# Patient Record
Sex: Female | Born: 1980
Health system: Southern US, Community
[De-identification: ages and names within clinical notes are randomized; demographics above are authoritative.]

## PROBLEM LIST (undated history)

## (undated) DIAGNOSIS — N1330 Unspecified hydronephrosis: Secondary | ICD-10-CM

## (undated) DIAGNOSIS — R350 Frequency of micturition: Secondary | ICD-10-CM

## (undated) DIAGNOSIS — Z973 Presence of spectacles and contact lenses: Secondary | ICD-10-CM

## (undated) DIAGNOSIS — J111 Influenza due to unidentified influenza virus with other respiratory manifestations: Secondary | ICD-10-CM

## (undated) DIAGNOSIS — G43109 Migraine with aura, not intractable, without status migrainosus: Secondary | ICD-10-CM

## (undated) DIAGNOSIS — I499 Cardiac arrhythmia, unspecified: Secondary | ICD-10-CM

## (undated) DIAGNOSIS — K5900 Constipation, unspecified: Secondary | ICD-10-CM

## (undated) DIAGNOSIS — K219 Gastro-esophageal reflux disease without esophagitis: Secondary | ICD-10-CM

## (undated) DIAGNOSIS — J309 Allergic rhinitis, unspecified: Secondary | ICD-10-CM

## (undated) DIAGNOSIS — R002 Palpitations: Secondary | ICD-10-CM

## (undated) DIAGNOSIS — D509 Iron deficiency anemia, unspecified: Secondary | ICD-10-CM

## (undated) DIAGNOSIS — E782 Mixed hyperlipidemia: Secondary | ICD-10-CM

## (undated) DIAGNOSIS — I493 Ventricular premature depolarization: Secondary | ICD-10-CM

## (undated) DIAGNOSIS — D259 Leiomyoma of uterus, unspecified: Secondary | ICD-10-CM

## (undated) DIAGNOSIS — N939 Abnormal uterine and vaginal bleeding, unspecified: Secondary | ICD-10-CM

## (undated) DIAGNOSIS — Z9109 Other allergy status, other than to drugs and biological substances: Secondary | ICD-10-CM

## (undated) DIAGNOSIS — E039 Hypothyroidism, unspecified: Secondary | ICD-10-CM

## (undated) HISTORY — DX: Migraine with aura, not intractable, without status migrainosus: G43.109

## (undated) HISTORY — PX: HYSTEROSCOPY: SHX211

## (undated) HISTORY — PX: DILATION AND CURETTAGE OF UTERUS: SHX78

## (undated) HISTORY — DX: Ventricular premature depolarization: I49.3

## (undated) HISTORY — DX: Other allergy status, other than to drugs and biological substances: Z91.09

## (undated) HISTORY — DX: Hypothyroidism, unspecified: E03.9

---

## 2001-09-19 ENCOUNTER — Emergency Department (HOSPITAL_COMMUNITY): Admission: EM | Admit: 2001-09-19 | Discharge: 2001-09-19 | Payer: Self-pay | Admitting: Emergency Medicine

## 2006-10-12 ENCOUNTER — Ambulatory Visit (HOSPITAL_COMMUNITY): Admission: RE | Admit: 2006-10-12 | Discharge: 2006-10-12 | Payer: Self-pay | Admitting: Obstetrics and Gynecology

## 2007-01-12 ENCOUNTER — Inpatient Hospital Stay (HOSPITAL_COMMUNITY): Admission: AD | Admit: 2007-01-12 | Discharge: 2007-01-16 | Payer: Self-pay | Admitting: Obstetrics and Gynecology

## 2009-03-07 ENCOUNTER — Inpatient Hospital Stay (HOSPITAL_COMMUNITY): Admission: RE | Admit: 2009-03-07 | Discharge: 2009-03-09 | Payer: Self-pay | Admitting: Obstetrics and Gynecology

## 2009-12-03 ENCOUNTER — Encounter: Payer: Self-pay | Admitting: Cardiology

## 2009-12-06 ENCOUNTER — Ambulatory Visit: Payer: Self-pay | Admitting: Interventional Radiology

## 2009-12-06 ENCOUNTER — Emergency Department (HOSPITAL_BASED_OUTPATIENT_CLINIC_OR_DEPARTMENT_OTHER): Admission: EM | Admit: 2009-12-06 | Discharge: 2009-12-06 | Payer: Self-pay | Admitting: Emergency Medicine

## 2010-09-15 ENCOUNTER — Encounter: Admission: RE | Admit: 2010-09-15 | Discharge: 2010-09-15 | Payer: Self-pay | Admitting: Obstetrics and Gynecology

## 2011-01-26 LAB — TYPE AND SCREEN
ABO/RH(D): A NEG
Antibody Screen: POSITIVE

## 2011-01-26 LAB — CBC
HCT: 35.5 % — ABNORMAL LOW (ref 36.0–46.0)
MCHC: 35.7 g/dL (ref 30.0–36.0)
MCV: 97.3 fL (ref 78.0–100.0)
Platelets: 154 10*3/uL (ref 150–400)
Platelets: 159 10*3/uL (ref 150–400)
RDW: 14.5 % (ref 11.5–15.5)
RDW: 14.5 % (ref 11.5–15.5)

## 2011-03-05 NOTE — Op Note (Signed)
NAMEARMIDA, Christie Barron             ACCOUNT NO.:  [id number]   MEDICAL RECORD NO.:  [id number]          PATIENT TYPE:  INP   LOCATION:  9134                          FACILITY:  [facility]   PHYSICIAN:  Zelphia Cairo, MD    DATE OF BIRTH:  06-Feb-1981   DATE OF PROCEDURE:  01/13/2007  DATE OF DISCHARGE:                               OPERATIVE REPORT   PREOPERATIVE DIAGNOSES:  1. Intrauterine pregnancy at 40 plus weeks.  2. Failed induction, failure to descend.   PROCEDURE:  Primary low transverse Cesarean delivery.   SURGEON:  Zelphia Cairo, M.D.   ESTIMATED BLOOD LOSS:  600 mL.   URINE OUTPUT:  200 mL.   FLUIDS:  1600 mL.   FINDINGS:  Viable female infant with Apgars of 9 and 10. Cord pH 7.34.  Weight 8 pounds 11 ounces. Normal pelvic anatomy.   COMPLICATIONS:  None.   CONDITION:  Stable to recovery room.   ANESTHESIA:  Epidural.   PROCEDURE:  The patient was taken to the operating room where epidural  anesthesia was found to be adequate. She was placed in the supine  position with a left tilt. She was prepped and draped in sterile  fashion. A Pfannenstiel skin incision was made with a scalpel and  carried down to the underlying fascia. The fascia was incised in the  midline and extended laterally using Mayo scissors. The fascia was  tented upwards with Kocher clamps, and the underlying rectus muscles  were dissected off using the Bovie. The peritoneum was then identified,  tented upwards and entered sharply with the Metzenbaum scissors. This  was extended superiorly and inferiorly with good visualization of the  bladder. The bladder blade was then inserted. The bladder flap was  created using Metzenbaum scissors, and the bladder blade was  repositioned to protect the bladder flap.   The uterine incision was then made with a scalpel and extended manually.  The fetal vertex was brought to the uterine incision, and fundal  pressure was applied. The fetus was delivered. Nuchal  cord x1 was easily  reduced. The mouth and nose were suctioned, and the infant was taken to  the waiting pediatric staff after the cord was clamped and cut. The  placenta was then manually removed from the uterus. The uterus was  exteriorized from the pelvis and manually massaged. A dry lap sponge was  then used to clear the inside of the uterus of all clots and debris. The  uterine incision was then closed using 1-0 chromic in a running locked  fashion. Hemostasis was assured. The uterus was then delivered back into  the pelvis. The pelvis was copiously irrigated with warm normal saline.  The  peritoneum was reapproximated using Vicryl. The fascia was closed using  0 PDS. The skin was closed with staples. The patient tolerated the  procedure well. Sponge, lap, needle and instrument counts were correct  x2. She received antibiotics after cord clamp.      Zelphia Cairo, MD  Electronically Signed     GA/MEDQ  D:  01/13/2007  T:  01/14/2007  Job:  [id number]

## 2011-03-05 NOTE — Discharge Summary (Signed)
NAMEMARQUETTA, Christie Barron             ACCOUNT NO.:  [id number]   MEDICAL RECORD NO.:  [id number]          PATIENT TYPE:  INP   LOCATION:  9112                          FACILITY:  [facility]   PHYSICIAN:  Dineen Kid. Rana Snare, M.D.    DATE OF BIRTH:  Aug 14, 1981   DATE OF ADMISSION:  03/07/2009  DATE OF DISCHARGE:  03/09/2009                               DISCHARGE SUMMARY   ADMITTING DIAGNOSES:  1. Intrauterine pregnancy at term.  2. Previous cesarean section, desires repeat.   DISCHARGE DIAGNOSES:  1. Status post low transverse cesarean section.  2. Viable female infant.   PROCEDURE:  Repeat low transverse cesarean section.   REASON FOR ADMISSION:  Please see written H and P.   HOSPITAL COURSE:  The patient is 30 year old white married female,  gravida 2, para 1 that was admitted to [facility] for  scheduled cesarean section.  The patient had had a previous cesarean  delivery and desired repeat.  On the morning of admission, the patient  was taken to the operating room, where spinal anesthesia was  administered without difficulty.  A low transverse incision was made  with delivery of a viable female infant, weighing 7 pounds 15 ounces  with Apgars of 9 at 1-minute and 9 at 5 minutes.  The patient tolerated  the procedure well and was taken to the recovery room in stable  condition.  On postoperative day #1, the patient was without complaint.  Vital signs were stable.  She was afebrile.  Fundus was firm and  nontender.  Incision was clean, dry, and intact.  Laboratory findings  showed a hemoglobin of 10.2.  On postoperative day #2, the patient did  desire early discharge.  Vital signs were stable.  She was afebrile.  Fundus was firm and nontender.  Incision was clean, dry, and intact.  Staples were intact.   DISCHARGE INSTRUCTIONS:  Reviewed and the patient was later discharged  home.   CONDITION ON DISCHARGE:  Stable.   DIET:  Regular, as tolerated.   ACTIVITY:  No heavy  lifting, no driving x2 weeks, and no vaginal entry.   FOLLOWUP:  The patient is to follow up in the office in 2-3 days for  staple removal.  She is to call for temperature greater than 100  degrees, persistent nausea, vomiting, heavy vaginal bleeding and/or  redness or drainage from the incisional site.   DISCHARGE MEDICATIONS:  1. Tylox #30 one p.o. every 4-6 hours p.r.n.  2. Motrin 600 mg every 6 hours.  3. Prenatal vitamins 1 p.o. daily.  4. Colace 1 p.o. daily p.r.n.      Julio Sicks, N.P.      Dineen Kid Rana Snare, M.D.  Electronically Signed    CC/MEDQ  D:  03/24/2009  T:  03/25/2009  Job:  [id number]

## 2011-03-05 NOTE — Discharge Summary (Signed)
Christie Barron, Christie Barron             ACCOUNT NO.:  [id number]   MEDICAL RECORD NO.:  [id number]          PATIENT TYPE:  INP   LOCATION:  9134                          FACILITY:  [facility]   PHYSICIAN:  Guy Sandifer. Henderson Cloud, M.D. DATE OF BIRTH:  [birth year]   DATE OF ADMISSION:  01/12/2007  DATE OF DISCHARGE:  01/16/2007                               DISCHARGE SUMMARY   ADMISSION DIAGNOSES:  1. Intrauterine pregnancy at 40-5/7 weeks estimated gestational age.  2. Induction of labor secondary to post date.   DISCHARGE DIAGNOSES:  1. Status post low transverse cesarean section secondary to failure to      descend.  2. A viable female infant.   PROCEDURE:  Primary low transverse cesarean section.   REASON FOR ADMISSION:  Please see written H&P.   HOSPITAL COURSE:  The patient is 30 year old primigravida who was  admitted to [facility] for post date induction of labor.  On admission the patient was noted to have some irregular contractions.  Vital signs were stable.  Fetal heart tones were reactive.  Cervical  exam revealed cervix dilated to 1-2 cm. The patient was started on  Pitocin and artificial rupture of membranes was performed which had  revealed some blood tinged fluid. The patient did progress to complete  dilatation.  However, vertex never descended beyond a 0 station and  molding was noted after 2 hours of pushing.  Fetal heart tones continued  to be in the 150-160 with good beat-to-beat variability, however, due to  failure to descend and vertex molding, decision was made to proceed with  a primary low transverse cesarean section.  The patient was then  transferred to the operating room where epidural was dosed to an  adequate surgical level.  Low transverse incision was made with delivery  of a viable female infant weighing 8 pounds 11 ounces, Apgars of 9 at 1  minute and 10 at 5 minutes.  Arterial cord pH of 7.34.  The patient  tolerated procedure well and taken to the  recovery room in stable  condition.  On postoperative day #1 the patient was without complaint.  Vital signs were stable.  Abdomen soft with good return of bowel  function.  Fundus was firm.  Abdominal dressing was noted to be clean,  dry and intact.  Laboratory findings revealed hemoglobin of 10.5,  platelet count 171,000 and WBC count 17,000. On postoperative day #2 the  patient was without complaint.  Vital signs remained stable.  She is  afebrile.  Abdomen soft.  Fundus firm and nontender.  Abdominal dressing  had been removed revealing an incision that is clean, dry and intact.  On postoperative day #3 the patient was without complaint.  Vital signs  remained stable.  Fundus firm and nontender.  Incision was clean, dry  and intact.  She removed.  The patient was later discharged home.   CONDITION ON DISCHARGE:  Good, diet regular as tolerated.   ACTIVITY:  No heavy lifting, no driving x2 weeks, no vaginal entry.   FOLLOW UP:  Patient to follow up in the office in one to  two week for an  incision check.  She is to call for temperature greater than 100  degrees, persistent nausea, vomiting, heavy vaginal bleeding and/or  redness or drainage from incisional site.   DISCHARGE MEDICATIONS:  1. Percocet 5/325 #30 one p.o. every 4-6 hours p.r.n.  2. Motrin 600 mg every 6 hours.  3. Prenatal vitamins one p.o. daily.      Julio Sicks, N.P.      Guy Sandifer. Henderson Cloud, M.D.  Electronically Signed    CC/MEDQ  D:  02/27/2007  T:  02/27/2007  Job:  [id number]

## 2011-04-17 ENCOUNTER — Emergency Department (HOSPITAL_BASED_OUTPATIENT_CLINIC_OR_DEPARTMENT_OTHER)
Admission: EM | Admit: 2011-04-17 | Discharge: 2011-04-18 | Disposition: A | Discharge status: Home / Self Care | Payer: BC Managed Care – PPO | Attending: Emergency Medicine | Admitting: Emergency Medicine

## 2011-04-17 DIAGNOSIS — R220 Localized swelling, mass and lump, head: Secondary | ICD-10-CM | POA: Insufficient documentation

## 2011-04-17 DIAGNOSIS — E039 Hypothyroidism, unspecified: Secondary | ICD-10-CM | POA: Insufficient documentation

## 2011-04-17 DIAGNOSIS — R221 Localized swelling, mass and lump, neck: Secondary | ICD-10-CM | POA: Insufficient documentation

## 2011-10-19 HISTORY — PX: WISDOM TOOTH EXTRACTION: SHX21

## 2012-08-12 ENCOUNTER — Emergency Department (HOSPITAL_COMMUNITY)
Admission: EM | Admit: 2012-08-12 | Discharge: 2012-08-12 | Disposition: A | Discharge status: Home / Self Care | Payer: BC Managed Care – PPO | Attending: Emergency Medicine | Admitting: Emergency Medicine

## 2012-08-12 ENCOUNTER — Emergency Department (HOSPITAL_COMMUNITY): Payer: BC Managed Care – PPO

## 2012-08-12 ENCOUNTER — Encounter (HOSPITAL_COMMUNITY): Payer: Self-pay | Admitting: Emergency Medicine

## 2012-08-12 DIAGNOSIS — E039 Hypothyroidism, unspecified: Secondary | ICD-10-CM | POA: Insufficient documentation

## 2012-08-12 DIAGNOSIS — R002 Palpitations: Secondary | ICD-10-CM | POA: Insufficient documentation

## 2012-08-12 DIAGNOSIS — R0602 Shortness of breath: Secondary | ICD-10-CM | POA: Insufficient documentation

## 2012-08-12 LAB — COMPREHENSIVE METABOLIC PANEL
AST: 18 U/L (ref 0–37)
Albumin: 4.1 g/dL (ref 3.5–5.2)
Chloride: 106 mEq/L (ref 96–112)
Creatinine, Ser: 0.79 mg/dL (ref 0.50–1.10)
Total Bilirubin: 0.3 mg/dL (ref 0.3–1.2)

## 2012-08-12 LAB — CBC WITH DIFFERENTIAL/PLATELET
Eosinophils Absolute: 0 10*3/uL (ref 0.0–0.7)
Lymphocytes Relative: 18 % (ref 12–46)
Lymphs Abs: 1.3 10*3/uL (ref 0.7–4.0)
Neutro Abs: 5.5 10*3/uL (ref 1.7–7.7)
Neutrophils Relative %: 76 % (ref 43–77)
Platelets: 240 10*3/uL (ref 150–400)
RBC: 4.17 MIL/uL (ref 3.87–5.11)
WBC: 7.2 10*3/uL (ref 4.0–10.5)

## 2012-08-12 LAB — T4, FREE: Free T4: 1.61 ng/dL (ref 0.80–1.80)

## 2012-08-12 LAB — TSH: TSH: 0.55 u[IU]/mL (ref 0.350–4.500)

## 2012-08-12 MED ORDER — POTASSIUM CHLORIDE 20 MEQ/15ML (10%) PO LIQD
60.0000 meq | Freq: Once | ORAL | Status: AC
  Administered 2012-08-12: 40 meq via ORAL

## 2012-08-12 MED ORDER — POTASSIUM CHLORIDE 20 MEQ/15ML (10%) PO LIQD
ORAL | Status: AC
  Administered 2012-08-12: 40 meq via ORAL
  Filled 2012-08-12: qty 30

## 2012-08-12 MED ORDER — POTASSIUM CHLORIDE 20 MEQ/15ML (10%) PO LIQD
ORAL | Status: AC
  Administered 2012-08-12: 20 meq
  Filled 2012-08-12: qty 15

## 2012-08-12 MED ORDER — POTASSIUM CHLORIDE CRYS ER 20 MEQ PO TBCR
60.0000 meq | EXTENDED_RELEASE_TABLET | Freq: Once | ORAL | Status: DC
  Filled 2012-08-12: qty 3

## 2012-08-12 NOTE — ED Provider Notes (Signed)
History     CSN: [id number]  Arrival date & time 08/12/12  1600   First MD Initiated Contact with Patient 08/12/12 1605      Chief Complaint  Patient presents with  . Palpitations    (Consider location/radiation/quality/duration/timing/severity/associated sxs/prior treatment) Patient is a 31 y.o. female presenting with palpitations. The history is provided by the patient.  Palpitations  This is a new problem. The current episode started less than 1 hour ago. The problem occurs constantly. The problem has been resolved. The problem is associated with an unknown factor. Associated symptoms include shortness of breath. Pertinent negatives include no fever, no syncope and no abdominal pain. Treatments tried: oxygen. The treatment provided significant relief. Risk factors include no known risk factors. Her past medical history does not include heart disease. Past medical history comments: hypothyroidism.    Past Medical History  Diagnosis Date  . Hypothyroidism     Past Surgical History  Procedure Date  . Cesarean section     Family History  Problem Relation Age of Onset  . Heart attack Mother     History  Substance Use Topics  . Smoking status: Not on file  . Smokeless tobacco: Not on file  . Alcohol Use:     OB History    Grav Para Term Preterm Abortions TAB SAB Ect Mult Living                  Review of Systems  Constitutional: Negative for fever.  Respiratory: Positive for shortness of breath.   Cardiovascular: Positive for palpitations. Negative for syncope.  Gastrointestinal: Negative for abdominal pain.  Genitourinary: Positive for vaginal bleeding (currently on menstrual cycle).  Neurological: Negative for syncope.  Psychiatric/Behavioral: Negative for confusion.  All other systems reviewed and are negative.    Allergies  Review of patient's allergies indicates no known allergies.  Home Medications   Current Outpatient Rx  Name Route Sig Dispense  Refill  . FLUTICASONE PROPIONATE 50 MCG/ACT NA SUSP Nasal Place 2 sprays into the nose daily.    Marland Kitchen ONE-DAILY MULTI VITAMINS PO TABS Oral Take 1 tablet by mouth daily.      There were no vitals taken for this visit.  Physical Exam Nursing note and vitals reviewed.  Constitutional: Pt is alert and appears stated age. Oropharynx: Airway open without erythema or exudate. Respiratory: No respiratory distress. Equal breathing bilaterally. CV: Extremities warm and well perfused. Neuro: No motor nor sensory deficit. Head: Normocephalic and atraumatic. Eyes: No conjunctivitis, no scleral icterus. Neck: Supple, no mass. Chest: Non-tender. Abdomen: Soft, non-tender MSK: Extremities are atraumatic without deformity. Skin: No rash, no wounds.   ED Course  Procedures (including critical care time)  Labs Reviewed  COMPREHENSIVE METABOLIC PANEL - Abnormal; Notable for the following:    Potassium 3.0 (*)     All other components within normal limits  CBC WITH DIFFERENTIAL  POCT PREGNANCY, URINE  TSH  T4, FREE   Dg Chest 2 View  08/12/2012  *RADIOLOGY REPORT*  Clinical Data: Chest pain, shortness of breath, palpitations  CHEST - 2 VIEW  Comparison: None.  Findings: Lungs are clear. No pleural effusion or pneumothorax.  Cardiomediastinal silhouette is within normal limits.  Visualized osseous structures are within normal limits.  IMPRESSION: No evidence of acute cardiopulmonary disease.   Original Report Authenticated By: Charline Bills, M.D.      1. Palpitations       MDM  31 y.o. female here with palpitations.  Pertinent past problems include  hypothyroidism, c section. States synthroid increased a few weeks ago. Looks well here. No further symptoms.   Medications/interventions:  KCl for low potassium.  Data reviewed: Record review: unremarkable. EKG ordered and interpreted by me: normal rate. Sinus arrythmia, normal axis. No ST changes, no QRS widening.  Lab tests ordered and  reviewed by me: upreg neg, CBC without anemia. CMP remarkable for low K. I independently viewed the following imaging studies and reviewed radiology's interpretation as summarized: CXR without acute disease.  Course of care: On re-eval, pt continues to be asymptomatic. Feel pt is appropriate for d/c and close follow up. Counseling provided regarding diagnosis, treatment plan, follow up recommendations, and return precautions. Questions answer   Medical Decision Making discussed with ED attending Toy Baker, MD          Charm Barges, MD 08/12/12 [phone]

## 2012-08-12 NOTE — ED Notes (Signed)
Pt undressed, in gown, on monitor, continuous pulse oximetry and blood pressure cuff; EKG performed; family at bedside 

## 2012-08-12 NOTE — ED Provider Notes (Signed)
I saw and evaluated the patient, reviewed the resident's note and I agree with the findings and plan.  Patient's labs and x-rays reviewed. Patient with prior workup for her palpitations. Pending her evaluation, she'll be discharged home to be seen by her cardiologist. 2 years ago she had complete workup including a Holter monitor as well as a 2-D echocardiogram which according to the patient was negative. Her EKG at this time shows no signs of QT prolongation or heart block  Toy Baker, MD 08/12/12 1719

## 2012-08-12 NOTE — ED Notes (Signed)
Per EMS: pt from work c/o palpitations that have resolved with oxygen; pt sts SOB and some tightness in chest; pt hx of same in past; IV 20g R AC

## 2012-08-13 NOTE — ED Provider Notes (Signed)
I saw and evaluated the patient, reviewed the resident's note and I agree with the findings and plan.  Gwendelyn Lanting T Alquan Morrish, MD 08/13/12 2249 

## 2012-12-02 ENCOUNTER — Other Ambulatory Visit: Payer: Self-pay

## 2012-12-12 ENCOUNTER — Encounter (HOSPITAL_BASED_OUTPATIENT_CLINIC_OR_DEPARTMENT_OTHER): Payer: Self-pay | Admitting: *Deleted

## 2012-12-12 ENCOUNTER — Emergency Department (HOSPITAL_BASED_OUTPATIENT_CLINIC_OR_DEPARTMENT_OTHER): Payer: BC Managed Care – PPO

## 2012-12-12 ENCOUNTER — Emergency Department (HOSPITAL_BASED_OUTPATIENT_CLINIC_OR_DEPARTMENT_OTHER)
Admission: EM | Admit: 2012-12-12 | Discharge: 2012-12-12 | Disposition: A | Discharge status: Home / Self Care | Payer: BC Managed Care – PPO | Attending: Emergency Medicine | Admitting: Emergency Medicine

## 2012-12-12 DIAGNOSIS — R110 Nausea: Secondary | ICD-10-CM | POA: Insufficient documentation

## 2012-12-12 DIAGNOSIS — Y939 Activity, unspecified: Secondary | ICD-10-CM | POA: Insufficient documentation

## 2012-12-12 DIAGNOSIS — Z79899 Other long term (current) drug therapy: Secondary | ICD-10-CM | POA: Insufficient documentation

## 2012-12-12 DIAGNOSIS — IMO0002 Reserved for concepts with insufficient information to code with codable children: Secondary | ICD-10-CM | POA: Insufficient documentation

## 2012-12-12 DIAGNOSIS — R42 Dizziness and giddiness: Secondary | ICD-10-CM | POA: Insufficient documentation

## 2012-12-12 DIAGNOSIS — E039 Hypothyroidism, unspecified: Secondary | ICD-10-CM | POA: Insufficient documentation

## 2012-12-12 DIAGNOSIS — F0781 Postconcussional syndrome: Secondary | ICD-10-CM

## 2012-12-12 DIAGNOSIS — Y929 Unspecified place or not applicable: Secondary | ICD-10-CM | POA: Insufficient documentation

## 2012-12-12 MED ORDER — ONDANSETRON 4 MG PO TBDP: 4.0000 mg | ORAL_TABLET | Freq: Three times a day (TID) | ORAL | Status: DC | PRN

## 2012-12-12 MED ORDER — ONDANSETRON 4 MG PO TBDP
4.0000 mg | ORAL_TABLET | Freq: Once | ORAL | Status: AC
  Administered 2012-12-12: 4 mg via ORAL
  Filled 2012-12-12: qty 1

## 2012-12-12 MED ORDER — IBUPROFEN 800 MG PO TABS: 800.0000 mg | ORAL_TABLET | Freq: Three times a day (TID) | ORAL | Status: DC

## 2012-12-12 MED ORDER — IBUPROFEN 800 MG PO TABS
800.0000 mg | ORAL_TABLET | Freq: Once | ORAL | Status: AC
  Administered 2012-12-12: 800 mg via ORAL
  Filled 2012-12-12: qty 1

## 2012-12-12 NOTE — ED Notes (Signed)
Pt c/o head injury x 1 day ago , c/o dizziness and nausea today

## 2012-12-12 NOTE — ED Provider Notes (Signed)
History     CSN: [id number]  Arrival date & time 12/12/12  1630   First MD Initiated Contact with Patient 12/12/12 1703      Chief Complaint  Patient presents with  . Head Injury    (Consider location/radiation/quality/duration/timing/severity/associated sxs/prior treatment) Patient is a 32 y.o. female presenting with head injury. The history is provided by the patient.  Head Injury Location:  Occipital Time since incident:  1 day Mechanism of injury: direct blow   Pain details:    Quality:  Aching   Severity:  Moderate   Timing:  Constant   Progression:  Worsening Chronicity:  New Relieved by:  Nothing Associated symptoms: headache and nausea   Pt complains of hitting her head on the head board.  Pt reports she has been having dizziness and nausea today.   Pt complains of a headache  Past Medical History  Diagnosis Date  . Hypothyroidism     Past Surgical History  Procedure Laterality Date  . Cesarean section      Family History  Problem Relation Age of Onset  . Heart attack Mother     History  Substance Use Topics  . Smoking status: Never Smoker   . Smokeless tobacco: Not on file  . Alcohol Use: No    OB History   Grav Para Term Preterm Abortions TAB SAB Ect Mult Living                  Review of Systems  Gastrointestinal: Positive for nausea.  Neurological: Positive for headaches.  All other systems reviewed and are negative.    Allergies  Review of patient's allergies indicates no known allergies.  Home Medications   Current Outpatient Rx  Name  Route  Sig  Dispense  Refill  . levothyroxine (SYNTHROID, LEVOTHROID) 100 MCG tablet   Oral   Take 100 mcg by mouth daily.         . montelukast (SINGULAIR) 10 MG tablet   Oral   Take 10 mg by mouth at bedtime.         . Multiple Vitamin (MULTIVITAMIN) tablet   Oral   Take 1 tablet by mouth daily.         Marland Kitchen topiramate (TOPAMAX) 100 MG tablet   Oral   Take 150 mg by mouth daily.          BP 129/78  Pulse 90  Temp(Src) 98.6 F (37 C)  Resp 16  Ht 5\' 3"  (1.6 m)  Wt 160 lb (72.576 kg)  BMI 28.35 kg/m2  SpO2 100%  LMP 12/05/2012  Physical Exam  Nursing note and vitals reviewed. Constitutional: She is oriented to person, place, and time. She appears well-developed.  HENT:  Head: Normocephalic and atraumatic.  Right Ear: External ear normal.  Left Ear: External ear normal.  Nose: Nose normal.  Mouth/Throat: Oropharynx is clear and moist.  Eyes: Conjunctivae are normal. Pupils are equal, round, and reactive to light.  Neck: Normal range of motion. Neck supple.  Cardiovascular: Normal rate and normal heart sounds.   Pulmonary/Chest: Effort normal.  Abdominal: Soft.  Musculoskeletal: Normal range of motion.  Neurological: She is alert and oriented to person, place, and time. She has normal reflexes. She displays normal reflexes. No cranial nerve deficit.  Skin: Skin is warm.  Psychiatric: She has a normal mood and affect.    ED Course  Procedures (including critical care time)  Labs Reviewed - No data to display Ct Head Wo Contrast  12/12/2012  *RADIOLOGY REPORT*  Clinical Data: Headache, head injury, struck posterior head last night  CT HEAD WITHOUT CONTRAST  Technique:  Contiguous axial images were obtained from the base of the skull through the vertex without contrast.  Comparison: 12/06/2009  Findings: Normal ventricular morphology. No midline shift or mass effect. Normal appearance of brain parenchyma. No intracranial hemorrhage, mass lesion, or acute infarction. Visualized paranasal sinuses and mastoid air cells clear. Bones unremarkable.  IMPRESSION: No acute intracranial abnormalities.   Original Report Authenticated By: Ulyses Southward, M.D.      No diagnosis found.    MDM  Ct scan no acute abnormality,  Pt given zofran and ibuprofen.   Pt advised to see her Md for recheck in 1 week.   Pt counseled on post concussive symptoms        Lonia Skinner [place], [place] 12/12/12 [phone]

## 2012-12-13 NOTE — ED Provider Notes (Signed)
Medical screening examination/treatment/procedure(s) were performed by non-physician practitioner and as supervising physician I was immediately available for consultation/collaboration.   Leilanni Halvorson III, MD 12/13/12 1434 

## 2013-05-11 ENCOUNTER — Ambulatory Visit (INDEPENDENT_AMBULATORY_CARE_PROVIDER_SITE_OTHER): Payer: BC Managed Care – PPO | Admitting: Internal Medicine

## 2013-05-11 ENCOUNTER — Encounter: Payer: Self-pay | Admitting: Internal Medicine

## 2013-05-11 VITALS — BP 110/70 | HR 68 | Temp 98.0°F | Ht 62.5 in | Wt 161.0 lb

## 2013-05-11 DIAGNOSIS — E039 Hypothyroidism, unspecified: Secondary | ICD-10-CM

## 2013-05-11 LAB — TSH: TSH: 0.69 u[IU]/mL (ref 0.35–5.50)

## 2013-05-11 NOTE — Progress Notes (Addendum)
Patient ID: Christie Barron, female   DOB: 07-Dec-1980, 32 y.o.   MRN: [id number]   HPI  Christie Barron is a 32 y.o.-year-old female, referred by her PCP, Dr. Sigmund Hazel, for management hypothyroidism.  Pt. has been dx with hypothyroidism in 2011, after birth of her second child; is on Levothyroxine 75, taken with water, fasting, separated by >30 min from b'fast. She had palpitations which increased after her Levothyroxine dose was increased to 100 mcg  >> subsequently decreased back to 75 mcg, but palpitation persisted >>  She was started on Metoprolol for palpitations in 07/2012.  I reviewed pt's thyroid tests: Lab Results  Component Value Date   TSH 0.550 08/12/2012   FREET4 1.61 08/12/2012   She had repeat tests in 02/2013 >> normal, per her report (no records).  Pt denies feeling nodules in neck, hoarseness, dysphagia/odynophagia, SOB with lying down; she c/o: - migraines with exertion (no HTN) - Topamax helps - heavy menstrual periods - they come every month (Dr. Farrel Gobble - ObGyn) - ultrasounds, labwork normal - + weight gain + 20 lbs in last 2 years - + fatigue - sleeps well, but feels tired: has 4 and 6 y/o children - constipation - + dry skin, + fingernails "peel off). Takes a MVI at night. - + hair falling - no depression/anxiety  Meals: - Breakfast: cereals - Lunch: sandwich - Dinner: meat + potatoes - Snacks: usually none  No sodas. Not a lot of vegetables.  Father has fibromyalgia.   She has a + FH of thyroid disorders in: mother, uncles.  No FH of thyroid cancer. No h/o radiation tx to head or neck.  ROS: also: Constitutional: +weight gain, + fatigue, + subjective hypothermia Eyes: + blurry vision, no xerophthalmia ENT: no sore throat, no nodules palpated in throat, no dysphagia/odynophagia, no hoarseness Cardiovascular: no CP/SOB/+ palpitations/leg swelling Respiratory: no cough/SOB Gastrointestinal: no N/V/D/+ C Musculoskeletal: + muscle/ + joint  aches Skin: no rashes, + hair loss Neurological: no tremors/numbness/tingling/dizziness, + HA Psychiatric: no depression/anxiety  Past Medical History  Diagnosis Date  . Hypothyroidism    Past Surgical History  Procedure Laterality Date  . Cesarean section  2008 and 2010   History   Social History  . Marital Status: Married    Spouse Name: N/A    Number of Children: 2   Occupational History  . Pharmacy tech   Social History Main Topics  . Smoking status: Never Smoker   . Smokeless tobacco: Not on file  . Alcohol Use: No  . Drug Use: No  . Sexually Active: No   Current Outpatient Prescriptions on File Prior to Visit  Medication Sig Dispense Refill  . montelukast (SINGULAIR) 10 MG tablet Take 10 mg by mouth at bedtime.      . Multiple Vitamin (MULTIVITAMIN) tablet Take 1 tablet by mouth daily.      Marland Kitchen topiramate (TOPAMAX) 100 MG tablet Take 150 mg by mouth daily.       No current facility-administered medications on file prior to visit.   No Known Allergies  Family History  Problem Relation Age of Onset  . Heart attack Mother    PE: BP 110/70  Pulse 68  Temp(Src) 98 F (36.7 C) (Oral)  Ht 5' 2.5" (1.588 m)  Wt 161 lb (73.029 kg)  BMI 28.96 kg/m2 Wt Readings from Last 3 Encounters:  05/11/13 161 lb (73.029 kg)  12/12/12 160 lb (72.576 kg)   Constitutional: overweight, in NAD Eyes: PERRLA, EOMI, no  exophthalmos ENT: moist mucous membranes, no thyromegaly, no cervical lymphadenopathy Cardiovascular: RRR, No MRG Respiratory: CTA B Gastrointestinal: abdomen soft, NT, ND, BS+ Musculoskeletal: no deformities, strength intact in all 4 Skin: moist, warm, no rashes Neurological: no tremor with outstretched hands, DTR normal in all 4  ASSESSMENT: 1. Hypothyroidism  PLAN:  1. Patient with well-controlled hypothyroidism per review of the records, and per patient's recall of her recent thyroid tests performed 2 months ago. She appears euthyroid, however she  complains of symptoms that might be related to uncontrolled hypothyroidism: Weight gain, fatigue, constipation, dry skin. - We therefore decided to recheck her thyroid tests: TSH, free T4, and also, since a high titer of anti-thyroid peroxidase antibodies can cause symptoms in the absence of thyroid function abnormality, we decided to also check these. It would be important to know whether she has a high antibody titer also since the presence of one autoimmune disorder can increase her is to have another one. - We discussed about the fact that, if the thyroid tests are normal, and her antibodies are not high, she might need to have  Investigation for other conditions. She had a CBC, which was normal, without anemia ( and other possible cause of fatigue). Celiac disease can also be, but usually no weight gain. I explained that there is no other endocrine disorder as well cause of weight gain and be fatigued except for Cushing syndrome, however, she does not have features of the disease, and I explained what these are (truncal obesity, moon facies, facial plethora, wide, purple striae, full supraclavicular fat pads, and skin atrophy, diabetes, hypertension, etc.). - I not sure about the reason for her heavy menstrual cycles, but she does not have other features of PCO S, for example acne or hirsutism, and her menstrual cycles appears to be regular, though heavy. She also had no problems getting pregnant. - I suggested that she might need to have rheumatologic workup, he tells me that her father has fibromyalgia, it would not be unreasonable for her to have this evaluation - I discussed with the patient that I can continue to follow her for her thyroid disease, or have her followup with Dr. Hyacinth Meeker and return to me as needed in the future. She agrees to followup with Dr. Hyacinth Meeker.  Office Visit on 05/11/2013  Component Date Value Range Status  . TSH 05/11/2013 0.69  0.35 - 5.50 uIU/mL Final  . Free T4 05/11/2013  1.09  0.60 - 1.60 ng/dL Final  . Thyroid Peroxidase Antibody 05/11/2013 22.9  <35.0 IU/mL Final   Comment:                            The thyroid microsomal antigen has been shown to be Thyroid                          Peroxidase (TPO).  This assay detects anti-TPO antibodies.   Msg sent:  Dear Ms Christie Barron, The Thyroid tests are normal, and the antibodies are not high. At this point, I would suggest a rheumatologic workup for your symptoms.  Please let me know if you have any questions. Sincerely, Carlus Pavlov MD

## 2013-05-11 NOTE — Patient Instructions (Addendum)
I will send you the results of your thyroid tests through MyChart.

## 2013-05-13 DIAGNOSIS — E039 Hypothyroidism, unspecified: Secondary | ICD-10-CM | POA: Insufficient documentation

## 2013-08-23 ENCOUNTER — Other Ambulatory Visit: Payer: Self-pay

## 2013-11-14 ENCOUNTER — Encounter: Payer: Self-pay | Admitting: Gynecology

## 2013-11-14 DIAGNOSIS — G43109 Migraine with aura, not intractable, without status migrainosus: Secondary | ICD-10-CM | POA: Insufficient documentation

## 2013-11-14 DIAGNOSIS — E039 Hypothyroidism, unspecified: Secondary | ICD-10-CM | POA: Insufficient documentation

## 2013-11-19 ENCOUNTER — Encounter: Payer: Self-pay | Admitting: Gynecology

## 2013-11-19 ENCOUNTER — Ambulatory Visit (INDEPENDENT_AMBULATORY_CARE_PROVIDER_SITE_OTHER): Payer: BC Managed Care – PPO | Admitting: Gynecology

## 2013-11-19 VITALS — BP 122/68 | HR 80 | Resp 12 | Ht 63.25 in | Wt 164.0 lb

## 2013-11-19 DIAGNOSIS — Z0000 Encounter for general adult medical examination without abnormal findings: Secondary | ICD-10-CM

## 2013-11-19 DIAGNOSIS — Z01419 Encounter for gynecological examination (general) (routine) without abnormal findings: Secondary | ICD-10-CM

## 2013-11-19 LAB — POCT URINALYSIS DIPSTICK
LEUKOCYTES UA: NEGATIVE
UROBILINOGEN UA: NEGATIVE
pH, UA: 5

## 2013-11-19 NOTE — Progress Notes (Signed)
33 y.o. Married Caucasian female   G2P2002 here for annual exam. Pt is currently sexually active.  Pt reports that her cycles are heavy, 7d of flow, some clots.  Pt reports that they had gotten better after synthroid was increased to 136mcg but got worse after decreased back to 16mcg.  Pt just saw endocrine last month, who is aware of the change in cycle.  Pt reports that her migraines are better on lopressor that she takes for palpitations.  Patient's last menstrual period was 10/30/2013.          Sexually active: yes  The current method of family planning is Husband has Vasectomy.    Exercising: no  The patient does not participate in regular exercise at present. Last pap: 07/07/12 NEG HR HPV Alcohol: no Tobacco: no BSE: Sometimes   Hgb: PCP ; Urine: Negative    Health Maintenance  Topic Date Due  . Influenza Vaccine  05/18/2013  . Pap Smear  07/08/2015  . Tetanus/tdap  10/18/2020    Family History  Problem Relation Age of Onset  . Heart attack Mother   . Hypertension Mother   . Diabetes Maternal Grandmother     Type 1    Patient Active Problem List   Diagnosis Date Noted  . Hypothyroidism   . Migraine with aura   . Unspecified hypothyroidism 05/13/2013    Past Medical History  Diagnosis Date  . Hypothyroidism   . Migraine with aura     Past Surgical History  Procedure Laterality Date  . Cesarean section      x2    Allergies: Review of patient's allergies indicates no known allergies.  Current Outpatient Prescriptions  Medication Sig Dispense Refill  . levothyroxine (SYNTHROID, LEVOTHROID) 75 MCG tablet Take 75 mcg by mouth daily before breakfast.      . metoprolol tartrate (LOPRESSOR) 25 MG tablet 25 mg. Half tab twice daily      . montelukast (SINGULAIR) 10 MG tablet Take 10 mg by mouth at bedtime.      . Multiple Vitamin (MULTIVITAMIN) tablet Take 1 tablet by mouth daily.      Marland Kitchen topiramate (TOPAMAX) 100 MG tablet Take 150 mg by mouth daily.      Marland Kitchen  AFLURIA PRESERVATIVE FREE injection        No current facility-administered medications for this visit.    ROS: Pertinent items are noted in HPI.  Exam:    BP 122/68  Pulse 80  Resp 12  Ht 5' 3.25" (1.607 m)  Wt 164 lb (74.39 kg)  BMI 28.81 kg/m2  LMP 10/30/2013 Weight change: @WEIGHTCHANGE @ Last 3 height recordings:  Ht Readings from Last 3 Encounters:  11/19/13 5' 3.25" (1.607 m)  05/11/13 5' 2.5" (1.588 m)  12/12/12 5\' 3"  (1.6 m)   General appearance: alert, cooperative and appears stated age Head: Normocephalic, without obvious abnormality, atraumatic Neck: no adenopathy, no carotid bruit, no JVD, supple, symmetrical, trachea midline and thyroid not enlarged, symmetric, no tenderness/mass/nodules Lungs: clear to auscultation bilaterally Breasts: normal appearance, no masses or tenderness Heart: regular rate and rhythm, S1, S2 normal, no murmur, click, rub or gallop Abdomen: soft, non-tender; bowel sounds normal; no masses,  no organomegaly Extremities: extremities normal, atraumatic, no cyanosis or edema Skin: Skin color, texture, turgor normal. No rashes or lesions Lymph nodes: Cervical, supraclavicular, and axillary nodes normal. no inguinal nodes palpated Neurologic: Grossly normal   Pelvic: External genitalia:  no lesions  Urethra: normal appearing urethra with no masses, tenderness or lesions              Bartholins and Skenes: normal                 Vagina: normal appearing vagina with normal color and discharge, no lesions              Cervix: normal appearance              Pap taken: no        Bimanual Exam:  Uterus:  uterus is normal size, shape, consistency and nontender                                      Adnexa:    normal adnexa in size, nontender and no masses                                      Rectovaginal: Confirms                                      Anus:  normal sphincter tone, no lesions  A: well woman menorrhagia     P: pap  smear not done today Menorrhagia seems liked to thyroid but pt cannot tolerate increased dose due to palpitations and cannot do ocp due to migraines counseled on breast self exam, mammography screening, adequate intake of calcium and vitamin D, diet and exercise return annually or prn   An After Visit Summary was printed and given to the patient.

## 2013-11-19 NOTE — Patient Instructions (Signed)

## 2014-08-19 ENCOUNTER — Encounter: Payer: Self-pay | Admitting: Gynecology

## 2015-03-26 ENCOUNTER — Other Ambulatory Visit: Payer: Self-pay | Admitting: Physician Assistant

## 2015-03-26 ENCOUNTER — Other Ambulatory Visit: Payer: Self-pay | Admitting: Otolaryngology

## 2015-03-26 DIAGNOSIS — R200 Anesthesia of skin: Secondary | ICD-10-CM

## 2015-03-26 DIAGNOSIS — H6981 Other specified disorders of Eustachian tube, right ear: Secondary | ICD-10-CM

## 2015-03-26 DIAGNOSIS — H6991 Unspecified Eustachian tube disorder, right ear: Secondary | ICD-10-CM

## 2015-03-26 DIAGNOSIS — R202 Paresthesia of skin: Secondary | ICD-10-CM

## 2015-03-26 DIAGNOSIS — H9201 Otalgia, right ear: Secondary | ICD-10-CM

## 2015-03-27 ENCOUNTER — Ambulatory Visit
Admission: RE | Admit: 2015-03-27 | Discharge: 2015-03-27 | Disposition: A | Discharge status: Home / Self Care | Payer: 59 | Source: Ambulatory Visit | Attending: Otolaryngology | Admitting: Otolaryngology

## 2015-03-27 DIAGNOSIS — H9201 Otalgia, right ear: Secondary | ICD-10-CM

## 2015-03-27 DIAGNOSIS — H6981 Other specified disorders of Eustachian tube, right ear: Secondary | ICD-10-CM

## 2015-03-27 DIAGNOSIS — R200 Anesthesia of skin: Secondary | ICD-10-CM

## 2016-02-06 ENCOUNTER — Ambulatory Visit: Payer: Self-pay | Admitting: Certified Nurse Midwife

## 2016-02-18 ENCOUNTER — Ambulatory Visit (INDEPENDENT_AMBULATORY_CARE_PROVIDER_SITE_OTHER): Payer: 59 | Admitting: Certified Nurse Midwife

## 2016-02-18 ENCOUNTER — Encounter: Payer: Self-pay | Admitting: Certified Nurse Midwife

## 2016-02-18 VITALS — BP 108/62 | HR 68 | Resp 16 | Ht 62.25 in | Wt 138.0 lb

## 2016-02-18 DIAGNOSIS — Z0000 Encounter for general adult medical examination without abnormal findings: Secondary | ICD-10-CM

## 2016-02-18 DIAGNOSIS — R102 Pelvic and perineal pain: Secondary | ICD-10-CM

## 2016-02-18 DIAGNOSIS — Z01419 Encounter for gynecological examination (general) (routine) without abnormal findings: Secondary | ICD-10-CM

## 2016-02-18 DIAGNOSIS — Z124 Encounter for screening for malignant neoplasm of cervix: Secondary | ICD-10-CM | POA: Diagnosis not present

## 2016-02-18 LAB — POCT URINALYSIS DIPSTICK
BILIRUBIN UA: NEGATIVE
Glucose, UA: NEGATIVE
Ketones, UA: NEGATIVE
LEUKOCYTES UA: NEGATIVE
NITRITE UA: NEGATIVE
PH UA: 5
PROTEIN UA: NEGATIVE
RBC UA: NEGATIVE
UROBILINOGEN UA: NEGATIVE

## 2016-02-18 NOTE — Progress Notes (Addendum)
35 y.o. G40P2002 Married  Caucasian Fe here for annual exam. Periods have changed to every 34 days now, after weight loss of 30 pounds. Duration  5 days, heavy day 1-2 and then light. Sees Kathyrn Lass PCP for medication management of Hypothyroid and Migraine Aura/labs and aex. All stable medications at this point. Complaining of pain with intercourse for the past few years even before children were born. Not sure if dryness, but " feels like something is being pushed against with sexual activity". Patient has also noted a change in consistency of stools to slightly thick and soft over the past few months, but has changed diet and lost weight. No blood in stool or black tarry stools or pain. No other health issues today. Planning vacation for this summer.  Patient's last menstrual period was 02/05/2016.          Sexually active: Yes.    The current method of family planning is vasectomy.    Exercising: Yes.    weights & stationary bike Smoker:  no  Health Maintenance: Pap: 07-07-12 neg HPV HR neg MMG:  none Colonoscopy:  none BMD:   none TDaP: within 49yrs Shingles: no Pneumonia: no Hep C and HIV: HIV neg during pregnancy Labs: poct urine-neg Self breast exam: done occ   reports that she has never smoked. She does not have any smokeless tobacco history on file. She reports that she does not drink alcohol or use illicit drugs.  Past Medical History  Diagnosis Date  . Hypothyroidism   . Migraine with aura     Past Surgical History  Procedure Laterality Date  . Cesarean section      x2    Current Outpatient Prescriptions  Medication Sig Dispense Refill  . fluticasone (FLONASE) 50 MCG/ACT nasal spray Place into both nostrils daily.    Marland Kitchen levocetirizine (XYZAL) 5 MG tablet     . levothyroxine (SYNTHROID, LEVOTHROID) 75 MCG tablet Take 75 mcg by mouth daily before breakfast.    . metoprolol tartrate (LOPRESSOR) 25 MG tablet 25 mg. Half tab twice daily    . montelukast (SINGULAIR) 10  MG tablet Take 10 mg by mouth at bedtime.    . Multiple Vitamin (MULTIVITAMIN) tablet Take 1 tablet by mouth daily.    Marland Kitchen topiramate (TOPAMAX) 100 MG tablet Take 150 mg by mouth daily.     No current facility-administered medications for this visit.    Family History  Problem Relation Age of Onset  . Heart attack Mother   . Hypertension Mother   . Diabetes Maternal Grandmother     Type 1    ROS:  Pertinent items are noted in HPI.  Otherwise, a comprehensive ROS was negative.  Exam:   BP 108/62 mmHg  Pulse 68  Resp 16  Ht 5' 2.25" (1.581 m)  Wt 138 lb (62.596 kg)  BMI 25.04 kg/m2  LMP 02/05/2016 Height: 5' 2.25" (158.1 cm) Ht Readings from Last 3 Encounters:  02/18/16 5' 2.25" (1.581 m)  11/19/13 5' 3.25" (1.607 m)  05/11/13 5' 2.5" (1.588 m)    General appearance: alert, cooperative and appears stated age Head: Normocephalic, without obvious abnormality, atraumatic Neck: no adenopathy, supple, symmetrical, trachea midline and thyroid normal to inspection and palpation Lungs: clear to auscultation bilaterally Breasts: normal appearance, no masses or tenderness, No nipple retraction or dimpling, No nipple discharge or bleeding, No axillary or supraclavicular adenopathy Heart: regular rate and rhythm Abdomen: soft, non-tender; no masses,  no organomegaly Extremities: extremities normal, atraumatic, no  cyanosis or edema Skin: Skin color, texture, turgor normal. No rashes or lesions Lymph nodes: Cervical, supraclavicular, and axillary nodes normal. No abnormal inguinal nodes palpated Neurologic: Grossly normal   Pelvic: External genitalia:  no lesions              Urethra:  normal appearing urethra with no masses, tenderness or lesions              Bartholin's and Skene's: normal                 Vagina: normal appearing vagina with normal color and discharge, no lesions              Cervix: normal,non tender, no lesions,               Pap taken: Yes.   Bimanual Exam:   Uterus:  normal size, contour, position, consistency, mobility, non-tender              Adnexa: normal adnexa and no mass, fullness, tenderness               Rectovaginal: Confirms               Anus:  normal sphincter tone, no lesions  Chaperone present: yes  A:  Well Woman with normal exam  Contraception spouse vasectomy  R/O vaginal infection  ?vaginal dryness with intercourse as source of pain vs position with intercourse.  Stool change with weight loss  Hypothyroid/migraine management with PCP    P:   Reviewed health and wellness pertinent to exam  Discussed change of position with sexual activity and use of coconut oil for moisture to see if this resolves the discomfort. Patient will try and advise if continues to be a problem.  Affirm taken will treat if indicated  Discussed increasing fiber in diet to see this changes stool to more normal appearance. Patient will advise if problems. Handout given.  Continue MD follow up as indicated  Pap smear as above with HPVHR   counseled on breast self exam, adequate intake of calcium and vitamin D, diet and exercise  return annually or prn  An After Visit Summary was printed and given to the patient.

## 2016-02-18 NOTE — Patient Instructions (Signed)
EXERCISE AND DIET:  We recommended that you start or continue a regular exercise program for good health. Regular exercise means any activity that makes your heart beat faster and makes you sweat.  We recommend exercising at least 30 minutes per day at least 3 days a week, preferably 4 or 5.  We also recommend a diet low in fat and sugar.  Inactivity, poor dietary choices and obesity can cause diabetes, heart attack, stroke, and kidney damage, among others.    ALCOHOL AND SMOKING:  Women should limit their alcohol intake to no more than 7 drinks/beers/glasses of wine (combined, not each!) per week. Moderation of alcohol intake to this level decreases your risk of breast cancer and liver damage. And of course, no recreational drugs are part of a healthy lifestyle.  And absolutely no smoking or even second hand smoke. Most people know smoking can cause heart and lung diseases, but did you know it also contributes to weakening of your bones? Aging of your skin?  Yellowing of your teeth and nails?  CALCIUM AND VITAMIN D:  Adequate intake of calcium and Vitamin D are recommended.  The recommendations for exact amounts of these supplements seem to change often, but generally speaking 600 mg of calcium (either carbonate or citrate) and 800 units of Vitamin D per day seems prudent. Certain women may benefit from higher intake of Vitamin D.  If you are among these women, your doctor will have told you during your visit.    PAP SMEARS:  Pap smears, to check for cervical cancer or precancers,  have traditionally been done yearly, although recent scientific advances have shown that most women can have pap smears less often.  However, every woman still should have a physical exam from her gynecologist every year. It will include a breast check, inspection of the vulva and vagina to check for abnormal growths or skin changes, a visual exam of the cervix, and then an exam to evaluate the size and shape of the uterus and  ovaries.  And after 35 years of age, a rectal exam is indicated to check for rectal cancers. We will also provide age appropriate advice regarding health maintenance, like when you should have certain vaccines, screening for sexually transmitted diseases, bone density testing, colonoscopy, mammograms, etc.   MAMMOGRAMS:  All women over 40 years old should have a yearly mammogram. Many facilities now offer a "3D" mammogram, which may cost around $50 extra out of pocket. If possible,  we recommend you accept the option to have the 3D mammogram performed.  It both reduces the number of women who will be called back for extra views which then turn out to be normal, and it is better than the routine mammogram at detecting truly abnormal areas.    COLONOSCOPY:  Colonoscopy to screen for colon cancer is recommended for all women at age 50.  We know, you hate the idea of the prep.  We agree, BUT, having colon cancer and not knowing it is worse!!  Colon cancer so often starts as a polyp that can be seen and removed at colonscopy, which can quite literally save your life!  And if your first colonoscopy is normal and you have no family history of colon cancer, most women don't have to have it again for 10 years.  Once every ten years, you can do something that may end up saving your life, right?  We will be happy to help you get it scheduled when you are ready.    Be sure to check your insurance coverage so you understand how much it will cost.  It may be covered as a preventative service at no cost, but you should check your particular policy.     High-Fiber Diet Fiber, also called dietary fiber, is a type of carbohydrate found in fruits, vegetables, whole grains, and beans. A high-fiber diet can have many health benefits. Your health care provider may recommend a high-fiber diet to help:  Prevent constipation. Fiber can make your bowel movements more regular.  Lower your cholesterol.  Relieve hemorrhoids,  uncomplicated diverticulosis, or irritable bowel syndrome.  Prevent overeating as part of a weight-loss plan.  Prevent heart disease, type 2 diabetes, and certain cancers. WHAT IS MY PLAN? The recommended daily intake of fiber includes:  38 grams for men under age 72.  81 grams for men over age 3.  56 grams for women under age 56.  17 grams for women over age 68. You can get the recommended daily intake of dietary fiber by eating a variety of fruits, vegetables, grains, and beans. Your health care provider may also recommend a fiber supplement if it is not possible to get enough fiber through your diet. WHAT DO I NEED TO KNOW ABOUT A HIGH-FIBER DIET?  Fiber supplements have not been widely studied for their effectiveness, so it is better to get fiber through food sources.  Always check the fiber content on thenutrition facts label of any prepackaged food. Look for foods that contain at least 5 grams of fiber per serving.  Ask your dietitian if you have questions about specific foods that are related to your condition, especially if those foods are not listed in the following section.  Increase your daily fiber consumption gradually. Increasing your intake of dietary fiber too quickly may cause bloating, cramping, or gas.  Drink plenty of water. Water helps you to digest fiber. WHAT FOODS CAN I EAT? Grains Whole-grain breads. Multigrain cereal. Oats and oatmeal. Brown rice. Barley. Bulgur wheat. [place]. Bran muffins. Popcorn. Rye wafer crackers. Vegetables Sweet potatoes. Spinach. Kale. Artichokes. Cabbage. Broccoli. Green peas. Carrots. Squash. Fruits Berries. Pears. Apples. Oranges. Avocados. Prunes and raisins. Dried figs. Meats and Other Protein Sources Navy, kidney, pinto, and soy beans. Split peas. Lentils. Nuts and seeds. Dairy Fiber-fortified yogurt. Beverages Fiber-fortified soy milk. Fiber-fortified orange juice. Other Fiber bars. The items listed above may not be  a complete list of recommended foods or beverages. Contact your dietitian for more options. WHAT FOODS ARE NOT RECOMMENDED? Grains White bread. Pasta made with refined flour. White rice. Vegetables Fried potatoes. Canned vegetables. Well-cooked vegetables.  Fruits Fruit juice. Cooked, strained fruit. Meats and Other Protein Sources Fatty cuts of meat. Fried Sales executive or fried fish. Dairy Milk. Yogurt. Cream cheese. Sour cream. Beverages Soft drinks. Other Cakes and pastries. Butter and oils. The items listed above may not be a complete list of foods and beverages to avoid. Contact your dietitian for more information. WHAT ARE SOME TIPS FOR INCLUDING HIGH-FIBER FOODS IN MY DIET?  Eat a wide variety of high-fiber foods.  Make sure that half of all grains consumed each day are whole grains.  Replace breads and cereals made from refined flour or white flour with whole-grain breads and cereals.  Replace white rice with brown rice, bulgur wheat, or millet.  Start the day with a breakfast that is high in fiber, such as a cereal that contains at least 5 grams of fiber per serving.  Use beans in place of meat  in soups, salads, or pasta.  Eat high-fiber snacks, such as berries, raw vegetables, nuts, or popcorn.   This information is not intended to replace advice given to you by your health care provider. Make sure you discuss any questions you have with your health care provider.   Document Released: 10/04/2005 Document Revised: 10/25/2014 Document Reviewed: 03/19/2014 Elsevier Interactive Patient Education [employer].

## 2016-02-18 NOTE — Addendum Note (Signed)
Addended by: Regina Eck on: 02/18/2016 03:03 PM   Modules accepted: Orders, SmartSet

## 2016-02-18 NOTE — Progress Notes (Signed)
Reviewed personally.  M. Suzanne Mallery Harshman, MD.  

## 2016-02-19 LAB — WET PREP BY MOLECULAR PROBE
CANDIDA SPECIES: NEGATIVE
Gardnerella vaginalis: NEGATIVE
TRICHOMONAS VAG: NEGATIVE

## 2016-02-20 LAB — IPS PAP TEST WITH HPV

## 2017-01-18 ENCOUNTER — Telehealth: Payer: Self-pay | Admitting: Certified Nurse Midwife

## 2017-01-18 NOTE — Telephone Encounter (Signed)
Left patient a message to call back to reschedule a future appointment that was cancelled by the provider for AEX. °

## 2017-01-28 DIAGNOSIS — E039 Hypothyroidism, unspecified: Secondary | ICD-10-CM | POA: Diagnosis not present

## 2017-01-28 DIAGNOSIS — J309 Allergic rhinitis, unspecified: Secondary | ICD-10-CM | POA: Diagnosis not present

## 2017-01-28 DIAGNOSIS — G43909 Migraine, unspecified, not intractable, without status migrainosus: Secondary | ICD-10-CM | POA: Diagnosis not present

## 2017-01-28 DIAGNOSIS — Z79899 Other long term (current) drug therapy: Secondary | ICD-10-CM | POA: Diagnosis not present

## 2017-02-23 ENCOUNTER — Ambulatory Visit: Payer: 59 | Admitting: Certified Nurse Midwife

## 2017-03-02 ENCOUNTER — Encounter: Payer: Self-pay | Admitting: Certified Nurse Midwife

## 2017-03-02 ENCOUNTER — Ambulatory Visit (INDEPENDENT_AMBULATORY_CARE_PROVIDER_SITE_OTHER): Payer: 59 | Admitting: Certified Nurse Midwife

## 2017-03-02 VITALS — BP 120/80 | HR 60 | Resp 12 | Ht 62.25 in | Wt 150.4 lb

## 2017-03-02 DIAGNOSIS — N946 Dysmenorrhea, unspecified: Secondary | ICD-10-CM

## 2017-03-02 DIAGNOSIS — Z124 Encounter for screening for malignant neoplasm of cervix: Secondary | ICD-10-CM | POA: Diagnosis not present

## 2017-03-02 DIAGNOSIS — Z01419 Encounter for gynecological examination (general) (routine) without abnormal findings: Secondary | ICD-10-CM

## 2017-03-02 DIAGNOSIS — E039 Hypothyroidism, unspecified: Secondary | ICD-10-CM | POA: Diagnosis not present

## 2017-03-02 DIAGNOSIS — R6889 Other general symptoms and signs: Secondary | ICD-10-CM

## 2017-03-02 DIAGNOSIS — N920 Excessive and frequent menstruation with regular cycle: Secondary | ICD-10-CM | POA: Diagnosis not present

## 2017-03-02 LAB — TSH: TSH: 2.19 mIU/L

## 2017-03-02 NOTE — Patient Instructions (Signed)

## 2017-03-02 NOTE — Progress Notes (Signed)
36 y.o. G104P2002 Married  Caucasian Fe here for annual exam. Periods are changing with duration 10 days with heavy days 3-4, then moderate and then tapers off with off and on bleeding.  Cycles are 21 days now, from previous 30 days. Cramping has increased in duration and length. Contraception is spouse vasectomy. Sees PCP Dr. Kathyrn Lass for aex and thyroid management, has been stable but has been over 6 months since lab check. Oral HSV only one outbreak this year. Has Rx. Valtrex for use. Some increased cold intolerance over the past few months, heat issues. No other health issues today. Planning vacation in [place].  Patient's last menstrual period was 02/10/2017.          Sexually active: Yes.    The current method of family planning is vasectomy.    Exercising: No.  The patient does not participate in regular exercise at present. Smoker:  no  Health Maintenance: Pap:  07-07-12 neg HPV HR neg, 02-18-16 neg no endos HPV HR neg History of Abnormal Pap: no MMG:  Left breast u/s 2011 Self Breast exams: yes Colonoscopy:  none BMD:   none TDaP:  2018 per patient Shingles: no Pneumonia: no Hep C and HIV: HIV neg during pregnancy Labs: if needed   reports that she has never smoked. She has never used smokeless tobacco. She reports that she does not drink alcohol or use drugs.  Past Medical History:  Diagnosis Date  . Hypothyroidism   . Migraine with aura     Past Surgical History:  Procedure Laterality Date  . CESAREAN SECTION     x2    Current Outpatient Prescriptions  Medication Sig Dispense Refill  . fluticasone (FLONASE) 50 MCG/ACT nasal spray Place into both nostrils daily.    Marland Kitchen levocetirizine (XYZAL) 5 MG tablet     . levothyroxine (SYNTHROID, LEVOTHROID) 75 MCG tablet Take 75 mcg by mouth daily before breakfast.    . metoprolol tartrate (LOPRESSOR) 25 MG tablet 25 mg. Half tab twice daily    . montelukast (SINGULAIR) 10 MG tablet Take 10 mg by mouth at bedtime.    . Multiple  Vitamin (MULTIVITAMIN) tablet Take 1 tablet by mouth daily.    Marland Kitchen topiramate (TOPAMAX) 100 MG tablet Take 150 mg by mouth daily.    . valACYclovir (VALTREX) 1000 MG tablet TAKE 2 TABLETS BY MOUTH EVERY 12 HOURS AS NEEDED  2   No current facility-administered medications for this visit.     Family History  Problem Relation Age of Onset  . Heart attack Mother   . Hypertension Mother   . Diabetes Maternal Grandmother        Type 1  . Kidney cancer Father        also in other parts of body now    ROS:  Pertinent items are noted in HPI.  Otherwise, a comprehensive ROS was negative.  Exam:   BP 120/80 (BP Location: Right Arm, Patient Position: Sitting, Cuff Size: Normal)   Pulse 60   Resp 12   Ht 5' 2.25" (1.581 m)   Wt 150 lb 6.4 oz (68.2 kg)   LMP 02/10/2017   BMI 27.29 kg/m  Height: 5' 2.25" (158.1 cm) Ht Readings from Last 3 Encounters:  03/02/17 5' 2.25" (1.581 m)  02/18/16 5' 2.25" (1.581 m)  11/19/13 5' 3.25" (1.607 m)    General appearance: alert, cooperative and appears stated age Head: Normocephalic, without obvious abnormality, atraumatic Neck: no adenopathy, supple, symmetrical, trachea midline and thyroid  normal to inspection and palpation Lungs: clear to auscultation bilaterally Breasts: normal appearance, no masses or tenderness, No nipple retraction or dimpling, No nipple discharge or bleeding, No axillary or supraclavicular adenopathy Heart: regular rate and rhythm Abdomen: soft, non-tender; no masses,  no organomegaly Extremities: extremities normal, atraumatic, no cyanosis or edema Skin: Skin color, texture, turgor normal. No rashes or lesions Lymph nodes: Cervical, supraclavicular, and axillary nodes normal. No abnormal inguinal nodes palpated Neurologic: Grossly normal   Pelvic: External genitalia:  no lesions              Urethra:  normal appearing urethra with no masses, tenderness or lesions              Bartholin's and Skene's: normal                  Vagina: normal appearing vagina with normal color and discharge, no lesions              Cervix: no cervical motion tenderness, no lesions and normal appearance              Pap taken: Yes.   Bimanual Exam:  Uterus:  normal size, contour, position, consistency, mobility, non-tender and anteverted              Adnexa: normal adnexa and no mass, fullness, tenderness               Rectovaginal: Confirms               Anus:  normal sphincter tone, no lesions  Chaperone present: yes  A:  Well Woman with normal exam  Contraception spouse vasectomy  Cycle change with longer heavier cycles now 21 days from 28-30 days, 10 day duration with menorrhagia/dysmenorrhea  Hypothyroid ? Stable medication PCP management  Migraine with aura on Topamax with PCP management  P:   Reviewed health and wellness pertinent to exam  Discussed change in cycle can be related to age change, fibroids, thyroid status change, but evaluation with PUS is warranted. Discussed checking TSH to make sure stable on medication, which can change, also age related cycle changes can occur also with weight gain.. Patient has increased weight over past year. Patient agreeable to PUS and labs today for evaluation. Patient will be called with insurance info and scheduled. Also discussed cycle management with IUD or POP or ablation option if needed.  Labs: TSH, Vitamin D  Reviewed warning signs of Migraine and need to advise if occurs.  Pap smear: yes   counseled on breast self exam, adequate intake of calcium and vitamin D, diet and exercise  return annually or prn  An After Visit Summary was printed and given to the patient.

## 2017-03-03 ENCOUNTER — Telehealth: Payer: Self-pay | Admitting: Certified Nurse Midwife

## 2017-03-03 LAB — VITAMIN D 25 HYDROXY (VIT D DEFICIENCY, FRACTURES): Vit D, 25-Hydroxy: 45 ng/mL (ref 30–100)

## 2017-03-03 LAB — IPS PAP TEST WITH REFLEX TO HPV

## 2017-03-03 NOTE — Telephone Encounter (Signed)
Patient returned call. Reviewed benefit for recommended ultrasound. Patient understood information presented. Patients states she will need to "think about it" and states she call back when ready to schedule.  Routing to [employer]

## 2017-04-04 ENCOUNTER — Telehealth: Payer: Self-pay | Admitting: Certified Nurse Midwife

## 2017-04-04 NOTE — Telephone Encounter (Signed)
Call placed to patient to follow up in regards to follow up for a recommended ultrasound. Left voicemail requesting a return call.     Cc: Christie Barron

## 2017-04-07 NOTE — Telephone Encounter (Signed)
Call placed to patient to follow up in regards to scheduling a recommended ultrasound. Left voicemail requesting a return call.   cc: Melvia Heaps, CNM

## 2017-04-07 NOTE — Telephone Encounter (Signed)
Patient returned call in regards to scheduling the recommended ultrasound. Patient states she is currently out of town on vacation and will need to review her work schedule before committing to an appointment.  Patient states she will call our office next week to discuss.   Routing to [employer], CNM

## 2017-04-12 NOTE — Telephone Encounter (Signed)
Call placed to patient to follow up to conversation on 04/07/17 to discuss scheduling recommended ultrasound. Left voicemail requesting a return call.   cc Melvia Heaps, CNM

## 2017-07-20 DIAGNOSIS — G43909 Migraine, unspecified, not intractable, without status migrainosus: Secondary | ICD-10-CM | POA: Diagnosis not present

## 2017-07-20 DIAGNOSIS — E039 Hypothyroidism, unspecified: Secondary | ICD-10-CM | POA: Diagnosis not present

## 2017-07-20 DIAGNOSIS — J309 Allergic rhinitis, unspecified: Secondary | ICD-10-CM | POA: Diagnosis not present

## 2017-09-22 ENCOUNTER — Telehealth: Payer: Self-pay | Admitting: Certified Nurse Midwife

## 2017-09-22 DIAGNOSIS — N946 Dysmenorrhea, unspecified: Secondary | ICD-10-CM

## 2017-09-22 DIAGNOSIS — N920 Excessive and frequent menstruation with regular cycle: Secondary | ICD-10-CM

## 2017-09-22 NOTE — Telephone Encounter (Signed)
I think is fine to proceed for evaluation of if no changes.

## 2017-09-22 NOTE — Telephone Encounter (Signed)
Patient would like to schedule her ultrasound

## 2017-09-22 NOTE — Telephone Encounter (Signed)
Christie Barron CNM patient was last seen on 03/02/2017 and PUS was ordered to evaluate menorrhagia with regular menses and dysmenorrhea. Patient did not proceed due to benefits. Okay to proceed or will patient need updated OV?  Cc: Dr.Miller

## 2017-09-23 NOTE — Telephone Encounter (Signed)
Return call to patient. States worsening of same symptoms since annual. Periods are long and heavy. Cramping and discomfort even when not on menses.  Husband with vasectomy. Denies chance of pregnancy. LMP 09-10-17 thru 09-20-17. Pelvic ultrasound scheduled for 09-27-17 at 230 with Dr Talbert Nan to follow.  Routing to provider for final review. Patient agreeable to disposition. Will close encounter.    CC: Dr Talbert Nan, Juluis Rainier

## 2017-09-27 ENCOUNTER — Encounter: Payer: Self-pay | Admitting: Obstetrics and Gynecology

## 2017-09-27 ENCOUNTER — Ambulatory Visit (INDEPENDENT_AMBULATORY_CARE_PROVIDER_SITE_OTHER): Payer: 59

## 2017-09-27 ENCOUNTER — Ambulatory Visit (INDEPENDENT_AMBULATORY_CARE_PROVIDER_SITE_OTHER): Payer: 59 | Admitting: Obstetrics and Gynecology

## 2017-09-27 ENCOUNTER — Other Ambulatory Visit: Payer: Self-pay

## 2017-09-27 VITALS — BP 118/78 | HR 64 | Resp 16 | Wt 156.0 lb

## 2017-09-27 DIAGNOSIS — N920 Excessive and frequent menstruation with regular cycle: Secondary | ICD-10-CM

## 2017-09-27 DIAGNOSIS — N939 Abnormal uterine and vaginal bleeding, unspecified: Secondary | ICD-10-CM

## 2017-09-27 DIAGNOSIS — N946 Dysmenorrhea, unspecified: Secondary | ICD-10-CM

## 2017-09-27 DIAGNOSIS — E038 Other specified hypothyroidism: Secondary | ICD-10-CM

## 2017-09-27 DIAGNOSIS — R102 Pelvic and perineal pain: Secondary | ICD-10-CM

## 2017-09-27 LAB — POCT URINE PREGNANCY: PREG TEST UR: NEGATIVE

## 2017-09-27 NOTE — Progress Notes (Signed)
GYNECOLOGY  VISIT   HPI: 36 y.o.   Married  Caucasian  female   G2P2002 with Patient's last menstrual period was 09/10/2017.   here for follow up of pelvic pain and irregular menstrual bleeding. Her cycles are very irregular, every 2-4 weeks x 4-10 days. Vary from normal to heavy. She can saturate a super tampon in 30 minutes. Sometimes will having random spotting, or spotting after intercourse. She has started having lower back pain and cramps with her cycles. She is even getting the cramping when she isn't on her cycle.  Vasectomy for contraception.  Normal TSH in May, 2018. Cycles have been like this for the last year.   Patient with a h/o migraine with aura.   No increase in stress. Kids are 8 and 10.   GYNECOLOGIC HISTORY: Patient's last menstrual period was 09/10/2017. Contraception:none Menopausal hormone therapy: none         OB History    Gravida Para Term Preterm AB Living   2 2 2     2    SAB TAB Ectopic Multiple Live Births           2         Patient Active Problem List   Diagnosis Date Noted  . Hypothyroidism   . Migraine with aura   . Unspecified hypothyroidism 05/13/2013    Past Medical History:  Diagnosis Date  . Hypothyroidism   . Migraine with aura     Past Surgical History:  Procedure Laterality Date  . CESAREAN SECTION     x2    Current Outpatient Medications  Medication Sig Dispense Refill  . fluticasone (FLONASE) 50 MCG/ACT nasal spray Place into both nostrils daily.    Marland Kitchen levocetirizine (XYZAL) 5 MG tablet     . levothyroxine (SYNTHROID, LEVOTHROID) 75 MCG tablet Take 75 mcg by mouth daily before breakfast.    . metoprolol tartrate (LOPRESSOR) 25 MG tablet 25 mg. Half tab twice daily    . Multiple Vitamin (MULTIVITAMIN) tablet Take 1 tablet by mouth daily.    Marland Kitchen topiramate (TOPAMAX) 100 MG tablet Take 150 mg by mouth daily.    . valACYclovir (VALTREX) 1000 MG tablet TAKE 2 TABLETS BY MOUTH EVERY 12 HOURS AS NEEDED  2   No current  facility-administered medications for this visit.    lopressor is for palpitations.   ALLERGIES: Patient has no known allergies.  Family History  Problem Relation Age of Onset  . Heart attack Mother   . Hypertension Mother   . Kidney cancer Father        also in other parts of body now  . Diabetes Maternal Grandmother        Type 1    Social History   Socioeconomic History  . Marital status: Married    Spouse name: Not on file  . Number of children: Not on file  . Years of education: Not on file  . Highest education level: Not on file  Social Needs  . Financial resource strain: Not on file  . Food insecurity - worry: Not on file  . Food insecurity - inability: Not on file  . Transportation needs - medical: Not on file  . Transportation needs - non-medical: Not on file  Occupational History  . Not on file  Tobacco Use  . Smoking status: Never Smoker  . Smokeless tobacco: Never Used  Substance and Sexual Activity  . Alcohol use: No  . Drug use: No  . Sexual activity: Yes  Partners: Male    Birth control/protection: Surgical    Comment: Husband has Vasectomy   Other Topics Concern  . Not on file  Social History Narrative  . Not on file    Review of Systems  Constitutional: Negative.   HENT: Negative.   Eyes: Negative.   Respiratory: Negative.   Cardiovascular: Negative.   Gastrointestinal: Negative.   Genitourinary:       Irregular menstrual bleeding  Musculoskeletal: Negative.   Skin: Negative.   Neurological: Negative.   Endo/Heme/Allergies: Negative.   Psychiatric/Behavioral: Negative.     PHYSICAL EXAMINATION:    BP 118/78 (BP Location: Right Arm, Patient Position: Sitting, Cuff Size: Normal)   Pulse 64   Resp 16   Wt 156 lb (70.8 kg)   LMP 09/10/2017   BMI 28.30 kg/m     General appearance: alert, cooperative and appears stated age Neck: no adenopathy, supple, symmetrical, trachea midline and thyroid normal to inspection and  palpation Abdomen: soft, non-tender; non distended, no masses,  no organomegaly  Pelvic: External genitalia:  no lesions              Urethra:  normal appearing urethra with no masses, tenderness or lesions              Bartholins and Skenes: normal                 Vagina: normal appearing vagina with normal color and discharge, no lesions              Cervix: no cervical motion tenderness and no lesions              Bimanual Exam:  Uterus:  normal size, contour, position, consistency, mobility, non-tender and anteverted              Adnexa: no mass, fullness, tenderness             Bladder: not tender  Pelvic floor: not tender  Chaperone was present for exam.  Ultrasound images reviewed with the patient. The patient has a small intramural myoma and a thickened endometrium without obvious polyps  ASSESSMENT 1 year h/o Abnormal uterine bleeding, new onset cramping. Ultrasound with thickened endometrium. She has a small myoma, not deviating the cavity. Pelvic pain, normal exam H/O hypothyroidism    PLAN TSH, CBC UPT negative Urine for GC/CT Not a candidate for OCP's (h/o migraine with aura) Return for a sonohysterogram after her cycle, possible endometrial biopsy After evaluation would discuss option of mirena IUD, the mini-pill, & depo-provera   An After Visit Summary was printed and given to the patient.  CC: Evalee Mutton, CNM

## 2017-09-28 LAB — CBC
HEMATOCRIT: 39.4 % (ref 34.0–46.6)
Hemoglobin: 13.6 g/dL (ref 11.1–15.9)
MCH: 31.4 pg (ref 26.6–33.0)
MCHC: 34.5 g/dL (ref 31.5–35.7)
MCV: 91 fL (ref 79–97)
Platelets: 247 10*3/uL (ref 150–379)
RBC: 4.33 x10E6/uL (ref 3.77–5.28)
RDW: 13.9 % (ref 12.3–15.4)
WBC: 5.7 10*3/uL (ref 3.4–10.8)

## 2017-09-28 LAB — GC/CHLAMYDIA PROBE AMP
CHLAMYDIA, DNA PROBE: NEGATIVE
Neisseria gonorrhoeae by PCR: NEGATIVE

## 2017-09-28 LAB — TSH: TSH: 2.25 u[IU]/mL (ref 0.450–4.500)

## 2017-09-29 ENCOUNTER — Telehealth: Payer: Self-pay | Admitting: Certified Nurse Midwife

## 2017-09-29 NOTE — Telephone Encounter (Signed)
Spoke with patient. Results given as seen below patient verbalizes understanding. Encounter closed.  Notes recorded by Salvadore Dom, MD on 09/28/2017 at 10:44 AM EST Results and any recommendations were sent via [place].   Hi Christie Barron, Your blood work was normal. Please call with any questions. Sumner Boast

## 2017-09-29 NOTE — Telephone Encounter (Signed)
Patient calling for urine results.

## 2017-10-14 ENCOUNTER — Telehealth: Payer: Self-pay | Admitting: Obstetrics and Gynecology

## 2017-10-14 NOTE — Telephone Encounter (Signed)
Patient called to schedule sonohysterogram with endometrial biopsy. Patient started her cycle on 10/07/17. Reviewed scheduling options with Nurse Supervisor. Patient is scheduled 10/20/16 with Dr Talbert Nan. Advised patient will forward to provider to verify since this is the first available appointment after she started her period and day 13 of her cycle, will this scheduled date be the appropriate time frame.  Also advised patient to abstain from intercourse until we can verify appointment date is appropriate.  Advised patient she will be notified if we need to make any changes to the appointment.  Patient acknowledges understanding and is agreeable.  Routing to Dr Talbert Nan  cc: Dr Talbert Nan

## 2017-10-19 NOTE — Telephone Encounter (Signed)
Reviewed with Dr Talbert Nan. Agreeable to appointment as scheduled.

## 2017-10-20 ENCOUNTER — Other Ambulatory Visit: Payer: Self-pay | Admitting: Obstetrics and Gynecology

## 2017-10-20 ENCOUNTER — Encounter: Payer: Self-pay | Admitting: Obstetrics and Gynecology

## 2017-10-20 ENCOUNTER — Other Ambulatory Visit: Payer: Self-pay

## 2017-10-20 ENCOUNTER — Ambulatory Visit (INDEPENDENT_AMBULATORY_CARE_PROVIDER_SITE_OTHER): Payer: 59 | Admitting: Obstetrics and Gynecology

## 2017-10-20 ENCOUNTER — Ambulatory Visit (INDEPENDENT_AMBULATORY_CARE_PROVIDER_SITE_OTHER): Payer: 59

## 2017-10-20 VITALS — BP 118/60 | HR 64 | Resp 14 | Wt 156.0 lb

## 2017-10-20 DIAGNOSIS — N939 Abnormal uterine and vaginal bleeding, unspecified: Secondary | ICD-10-CM | POA: Diagnosis not present

## 2017-10-20 DIAGNOSIS — N946 Dysmenorrhea, unspecified: Secondary | ICD-10-CM | POA: Diagnosis not present

## 2017-10-20 DIAGNOSIS — D259 Leiomyoma of uterus, unspecified: Secondary | ICD-10-CM | POA: Diagnosis not present

## 2017-10-20 DIAGNOSIS — R102 Pelvic and perineal pain: Secondary | ICD-10-CM

## 2017-10-20 NOTE — Progress Notes (Signed)
GYNECOLOGY  VISIT   HPI: 37 y.o.   Married  Caucasian  female   G2P2002 with Patient's last menstrual period was 10/07/2017.   here for follow up abnormal uterine bleeding. For the last year, the patient has had irregular cyces q 2-4 weeks x 4-10 days. Vary from normal to very heavy (can saturate a super tampon in 30 minutes). Some random spotting. Cycles have been irregular for a long time.  She is having cramping and lower back pain with her cycles in the last few years. She is now also having menstrual type pain and lower back pain in between her cycles. The pain is intermittent, can just be a quick sudden pain or crampy pain that can last a couple of hours. The pain is mild, but annoying. Not changing in frequency. Normal TSH, CBC and genprobe in 12/18.  Vasectomy for contraception.   H/O migraine with Aura.    GYNECOLOGIC HISTORY: Patient's last menstrual period was 10/07/2017. Contraception:vasectomy  Menopausal hormone therapy: none         OB History    Gravida Para Term Preterm AB Living   2 2 2     2    SAB TAB Ectopic Multiple Live Births           2         Patient Active Problem List   Diagnosis Date Noted  . Hypothyroidism   . Migraine with aura   . Unspecified hypothyroidism 05/13/2013    Past Medical History:  Diagnosis Date  . Hypothyroidism   . Migraine with aura     Past Surgical History:  Procedure Laterality Date  . CESAREAN SECTION     x2    Current Outpatient Medications  Medication Sig Dispense Refill  . fluticasone (FLONASE) 50 MCG/ACT nasal spray Place into both nostrils daily.    Marland Kitchen levocetirizine (XYZAL) 5 MG tablet     . levothyroxine (SYNTHROID, LEVOTHROID) 75 MCG tablet Take 75 mcg by mouth daily before breakfast.    . metoprolol tartrate (LOPRESSOR) 25 MG tablet 25 mg. Half tab twice daily    . Multiple Vitamin (MULTIVITAMIN) tablet Take 1 tablet by mouth daily.    Marland Kitchen topiramate (TOPAMAX) 100 MG tablet Take 150 mg by mouth daily.    .  valACYclovir (VALTREX) 1000 MG tablet TAKE 2 TABLETS BY MOUTH EVERY 12 HOURS AS NEEDED  2   No current facility-administered medications for this visit.      ALLERGIES: Patient has no known allergies.  Family History  Problem Relation Age of Onset  . Heart attack Mother   . Hypertension Mother   . Kidney cancer Father        also in other parts of body now  . Diabetes Maternal Grandmother        Type 1    Social History   Socioeconomic History  . Marital status: Married    Spouse name: Not on file  . Number of children: Not on file  . Years of education: Not on file  . Highest education level: Not on file  Social Needs  . Financial resource strain: Not on file  . Food insecurity - worry: Not on file  . Food insecurity - inability: Not on file  . Transportation needs - medical: Not on file  . Transportation needs - non-medical: Not on file  Occupational History  . Not on file  Tobacco Use  . Smoking status: Never Smoker  . Smokeless tobacco: Never Used  Substance and Sexual Activity  . Alcohol use: No  . Drug use: No  . Sexual activity: Yes    Partners: Male    Birth control/protection: Surgical    Comment: Husband has Vasectomy   Other Topics Concern  . Not on file  Social History Narrative  . Not on file    Review of Systems  Constitutional: Negative.   HENT: Negative.   Eyes: Negative.   Respiratory: Negative.   Cardiovascular: Negative.   Gastrointestinal: Negative.   Genitourinary: Negative.   Musculoskeletal: Negative.   Skin: Negative.   Neurological: Negative.   Endo/Heme/Allergies: Negative.   Psychiatric/Behavioral: Negative.     PHYSICAL EXAMINATION:    BP 118/60 (BP Location: Right Arm, Patient Position: Sitting, Cuff Size: Normal)   Pulse 64   Resp 14   Wt 156 lb (70.8 kg)   LMP 10/07/2017   BMI 28.30 kg/m     General appearance: alert, cooperative and appears stated age  Pelvic: External genitalia:  no lesions               Urethra:  normal appearing urethra with no masses, tenderness or lesions              Bartholins and Skenes: normal                 Vagina: normal appearing vagina with normal color and discharge, no lesions              Cervix: no lesion  Sonohysterogram The procedure and risks of the procedure were reviewed with the patient, consent form was signed. A speculum was placed in the vagina and the cervix was cleansed with betadine. The sonohysterogram catheter was inserted into the uterine cavity without difficulty. The speculum was removed and the ultrasound probe was inserted into the vagina. Saline was infused under direct observation with the ultrasound. A small endometrial polyp was seen near the fundus.The catheter was removed.    Chaperone was present for exam.  ASSESSMENT Menometrorrhagia Endometrial polyp Fibroid, ? Degenerating and enlarging in the last month    PLAN Hysteroscopy, D&C Not a candidate for OCP's Discussed the mirena IUD to control her cycles and help with cramps, she isn't sure she wants to do this, but is willing to consider it (information given) F/U u/s in 2 months to check the fibroid, also discussed possible MRI.     An After Visit Summary was printed and given to the patient.  Over 15 minutes face to face time of which over 50% was spent in counseling.

## 2017-10-24 ENCOUNTER — Telehealth: Payer: Self-pay | Admitting: Obstetrics and Gynecology

## 2017-10-24 NOTE — Telephone Encounter (Signed)
Patient had sonohystogram Thursday 10/20/17 and is having some spotting and cramping that began Saturday.  States she notices a "chunky" discharge that was very dark brown.  Today discharge is a light brown and still cramping.

## 2017-10-24 NOTE — Telephone Encounter (Addendum)
Spoke with patient. SHGM on 1/3, reports a "thick, chunky, dark brown vaginal d/c" that started on 1/5. Reports lower back and abdominal cramping that started on the same day as vaginal d/c, 1/5.  3/10 on pain scale, has not taken any medication. Unsure if back pain r/t d/c or because she has been on her feet working all weekend.   Describes d/c today as thin and light brown, not blood or old blood, small amount in panty liner or when wiping.  LMP 10/07/17. Spouse vasectomy.   Denies N/V, fever/chills, odor, urinary complaints.   Advised will review with Dr. Talbert Nan and return call with recommendations, patient is agreeable.   Dr. Talbert Nan -please review and advise?

## 2017-10-24 NOTE — Telephone Encounter (Signed)
I'm not worried about the spotting, but if she is having pain she should be seen. Per Christie Barron, the patient's discomfort started a few days after her sonohystergram.

## 2017-10-24 NOTE — Telephone Encounter (Signed)
Spoke with patient. Advised as seen below per Dr. Talbert Nan. OV scheduled for 10/25/17 at 2pm with Dr. Talbert Nan.   ER precautions reviewed -severe pain, bleeding or fever.   Patient verbalizes understanding and is agreeable.   Routing to provider for final review. Patient is agreeable to disposition. Will close encounter.

## 2017-10-25 ENCOUNTER — Other Ambulatory Visit: Payer: Self-pay

## 2017-10-25 ENCOUNTER — Encounter: Payer: Self-pay | Admitting: Obstetrics and Gynecology

## 2017-10-25 ENCOUNTER — Ambulatory Visit (INDEPENDENT_AMBULATORY_CARE_PROVIDER_SITE_OTHER): Payer: 59 | Admitting: Obstetrics and Gynecology

## 2017-10-25 VITALS — BP 112/60 | HR 80 | Resp 14 | Wt 154.0 lb

## 2017-10-25 DIAGNOSIS — R102 Pelvic and perineal pain: Secondary | ICD-10-CM

## 2017-10-25 DIAGNOSIS — N920 Excessive and frequent menstruation with regular cycle: Secondary | ICD-10-CM

## 2017-10-25 NOTE — Progress Notes (Signed)
GYNECOLOGY  VISIT   HPI: 37 y.o.   Married  Caucasian  female   G2P2002 with Patient's last menstrual period was 10/07/2017.   here c/o abdominal pain and vaginal discharge x 3 days. The patient was seen last week for a sonohysterogram, small polyp was seen. The discharge was light brown and thin, seems better now. On Saturday she had some chunky brown d/c.  The patient has been cramping for the last 3 days intermittently, more to the RLQ, yesterday was the worst, today is less intense. Yesterday the cramping was up to a 4/10 in severity. Today a 3/10 in severity. No fevers, nausea, vomiting, diarrhea, constipation, or urinary c/o.     GYNECOLOGIC HISTORY: Patient's last menstrual period was 10/07/2017. Contraception:vasectomy  Menopausal hormone therapy: none         OB History    Gravida Para Term Preterm AB Living   2 2 2     2    SAB TAB Ectopic Multiple Live Births           2         Patient Active Problem List   Diagnosis Date Noted  . Hypothyroidism   . Migraine with aura   . Unspecified hypothyroidism 05/13/2013    Past Medical History:  Diagnosis Date  . Hypothyroidism   . Migraine with aura     Past Surgical History:  Procedure Laterality Date  . CESAREAN SECTION     x2    Current Outpatient Medications  Medication Sig Dispense Refill  . fluticasone (FLONASE) 50 MCG/ACT nasal spray Place into both nostrils daily.    Marland Kitchen levocetirizine (XYZAL) 5 MG tablet     . levothyroxine (SYNTHROID, LEVOTHROID) 75 MCG tablet Take 75 mcg by mouth daily before breakfast.    . metoprolol tartrate (LOPRESSOR) 25 MG tablet 25 mg. Half tab twice daily    . Multiple Vitamin (MULTIVITAMIN) tablet Take 1 tablet by mouth daily.    Marland Kitchen topiramate (TOPAMAX) 100 MG tablet Take 150 mg by mouth daily.    . valACYclovir (VALTREX) 1000 MG tablet TAKE 2 TABLETS BY MOUTH EVERY 12 HOURS AS NEEDED  2   No current facility-administered medications for this visit.      ALLERGIES: Patient has no  known allergies.  Family History  Problem Relation Age of Onset  . Heart attack Mother   . Hypertension Mother   . Kidney cancer Father        also in other parts of body now  . Diabetes Maternal Grandmother        Type 1    Social History   Socioeconomic History  . Marital status: Married    Spouse name: Not on file  . Number of children: Not on file  . Years of education: Not on file  . Highest education level: Not on file  Social Needs  . Financial resource strain: Not on file  . Food insecurity - worry: Not on file  . Food insecurity - inability: Not on file  . Transportation needs - medical: Not on file  . Transportation needs - non-medical: Not on file  Occupational History  . Not on file  Tobacco Use  . Smoking status: Never Smoker  . Smokeless tobacco: Never Used  Substance and Sexual Activity  . Alcohol use: No  . Drug use: No  . Sexual activity: Yes    Partners: Male    Birth control/protection: Surgical    Comment: Husband has Vasectomy  Other Topics Concern  . Not on file  Social History Narrative  . Not on file    Review of Systems  Constitutional: Negative.   HENT: Negative.   Eyes: Negative.   Respiratory: Negative.   Cardiovascular: Negative.   Gastrointestinal: Positive for abdominal pain.  Genitourinary:       Vaginal discharge   Musculoskeletal: Negative.   Skin: Negative.   Neurological: Negative.   Endo/Heme/Allergies: Negative.   Psychiatric/Behavioral: Negative.     PHYSICAL EXAMINATION:    BP 112/60 (BP Location: Right Arm, Patient Position: Sitting, Cuff Size: Normal)   Pulse 80   Resp 14   Wt 154 lb (69.9 kg)   LMP 10/07/2017   BMI 27.94 kg/m     General appearance: alert, cooperative and appears stated age Abdomen: soft, non-tender; non distended, no masses,  no organomegaly  Pelvic: External genitalia:  no lesions              Urethra:  normal appearing urethra with no masses, tenderness or lesions               Bartholins and Skenes: normal                 Vagina: normal appearing vagina with normal color and discharge, no lesions              Cervix: no cervical motion tenderness and no lesions              Bimanual Exam:  Uterus:  normal size, contour, position, consistency, mobility, non-tender              Adnexa: no mass, fullness, tenderness               Chaperone was present for exam.  ASSESSMENT Pelvic cramping (more on the right), she is 5 days s/p sonohysterogram, normal pelvic exam. No signs of infection. Cramping has improved some Spotting, improved    PLAN Take over the counter ibuprofen for pain Call with fevers, worsening pain or any other concerns    An After Visit Summary was printed and given to the patient.

## 2017-11-03 ENCOUNTER — Telehealth: Payer: Self-pay | Admitting: Obstetrics and Gynecology

## 2017-11-03 NOTE — Telephone Encounter (Signed)
Call placed to patient to review benefits for recommend surgical procedure. Patient advised she is at work at the moment and will call back to our office later

## 2017-11-03 NOTE — Telephone Encounter (Signed)
Spoke with patient regarding benefits for recommended surgical procedure. Patient understood information presented. Patient aware this is professional benefit only. Patient aware will be contacted by hospital for separate benefits. Patient will call back to our office to confirm and proceed with scheduling.   cc: Lamont Snowball, RN

## 2017-11-03 NOTE — Telephone Encounter (Signed)
Returning a call to [place].

## 2017-11-04 DIAGNOSIS — G43909 Migraine, unspecified, not intractable, without status migrainosus: Secondary | ICD-10-CM | POA: Diagnosis not present

## 2017-11-04 NOTE — Telephone Encounter (Signed)
Return call to patient. Surgery date options discussed and patient requests 12-12-17.  Surgery instruction sheet reviewed and printed copy will be mailed. Questions answered and consult scheduled with Dr Talbert Nan for 11-24-17.  Routing to provider for final review. Patient agreeable to disposition. Will close encounter.

## 2017-11-04 NOTE — Telephone Encounter (Signed)
Patient paid her $178.98 surgery prepay over the phone today.  Patient also has questions about when and where the surgery will be and request the phone number.  Cc: Lerry Liner

## 2017-11-22 ENCOUNTER — Telehealth: Payer: Self-pay | Admitting: Obstetrics and Gynecology

## 2017-11-22 NOTE — Telephone Encounter (Signed)
Patient called stating she is scheduled for a pre-operative appointment on 11/24/17.  Patient would like to know if she can schedule her follow up ultrasound on the same date. Forwarding triage to review  Forwarding to Triage Nurse

## 2017-11-22 NOTE — Telephone Encounter (Signed)
Patient is scheduled for a D&C on 12/12/2017. Patient had a [place] on 10/20/17 that showed a small fibroid was supposed to have 2 month follow up PUS. This was before surgery was scheduled. Does she still need follow up PUS?

## 2017-11-23 NOTE — Telephone Encounter (Signed)
Yes she still needs the f/u ultrasound in early March. It is to f/u on a uterine mass, ? Degenerating fibroid.

## 2017-11-23 NOTE — Telephone Encounter (Signed)
Spoke with patient. Advised of message as seen below from [place]. Patient declines to schedule PUS at this time. Will schedule with upcoming appointment in office.  Routing to provider for final review. Patient agreeable to disposition. Will close encounter.

## 2017-11-24 ENCOUNTER — Encounter: Payer: Self-pay | Admitting: Obstetrics and Gynecology

## 2017-11-24 ENCOUNTER — Other Ambulatory Visit: Payer: Self-pay

## 2017-11-24 ENCOUNTER — Ambulatory Visit (INDEPENDENT_AMBULATORY_CARE_PROVIDER_SITE_OTHER): Payer: 59 | Admitting: Obstetrics and Gynecology

## 2017-11-24 VITALS — BP 122/70 | HR 84 | Resp 14 | Wt 156.0 lb

## 2017-11-24 DIAGNOSIS — N840 Polyp of corpus uteri: Secondary | ICD-10-CM

## 2017-11-24 DIAGNOSIS — N921 Excessive and frequent menstruation with irregular cycle: Secondary | ICD-10-CM

## 2017-11-24 NOTE — Progress Notes (Signed)
GYNECOLOGY  VISIT   HPI: 37 y.o.   Married  Caucasian  female   G2P2002 with Patient's last menstrual period was 10/31/2017.   here for surgery consult. Patient is scheduled for D& C hysteroscopy 12/12/17  The patient presented with AUB and pelvic cramping. Normal TSH, CBC and negative genprobe. Ultrasound with possible degenerating fibroid (subserosal) and sonohysterogram with endometrial polyp.    GYNECOLOGIC HISTORY: Patient's last menstrual period was 10/31/2017. Contraception:vasectomy  Menopausal hormone therapy: none         OB History    Gravida Para Term Preterm AB Living   2 2 2     2    SAB TAB Ectopic Multiple Live Births           2         Patient Active Problem List   Diagnosis Date Noted  . Hypothyroidism   . Migraine with aura   . Unspecified hypothyroidism 05/13/2013    Past Medical History:  Diagnosis Date  . Environmental allergies   . Hypothyroidism   . Migraine with aura   . PVC (premature ventricular contraction)     Past Surgical History:  Procedure Laterality Date  . CESAREAN SECTION     x2    Current Outpatient Medications  Medication Sig Dispense Refill  . fluticasone (FLONASE) 50 MCG/ACT nasal spray Place into both nostrils daily.    Marland Kitchen levocetirizine (XYZAL) 5 MG tablet     . levothyroxine (SYNTHROID, LEVOTHROID) 75 MCG tablet Take 75 mcg by mouth daily before breakfast.    . metoprolol tartrate (LOPRESSOR) 25 MG tablet 25 mg. Half tab twice daily    . Multiple Vitamin (MULTIVITAMIN) tablet Take 1 tablet by mouth daily.    Marland Kitchen topiramate (TOPAMAX) 100 MG tablet Take 150 mg by mouth daily.    . valACYclovir (VALTREX) 1000 MG tablet TAKE 2 TABLETS BY MOUTH EVERY 12 HOURS AS NEEDED  2   No current facility-administered medications for this visit.      ALLERGIES: Patient has no known allergies.  Family History  Problem Relation Age of Onset  . Heart attack Mother   . Hypertension Mother   . Kidney cancer Father        also in other  parts of body now  . Diabetes Maternal Grandmother        Type 1    Social History   Socioeconomic History  . Marital status: Married    Spouse name: Not on file  . Number of children: Not on file  . Years of education: Not on file  . Highest education level: Not on file  Social Needs  . Financial resource strain: Not on file  . Food insecurity - worry: Not on file  . Food insecurity - inability: Not on file  . Transportation needs - medical: Not on file  . Transportation needs - non-medical: Not on file  Occupational History  . Not on file  Tobacco Use  . Smoking status: Never Smoker  . Smokeless tobacco: Never Used  Substance and Sexual Activity  . Alcohol use: No  . Drug use: No  . Sexual activity: Yes    Partners: Male    Birth control/protection: Surgical    Comment: Husband has Vasectomy   Other Topics Concern  . Not on file  Social History Narrative  . Not on file    Review of Systems  Constitutional: Negative.   HENT: Negative.   Eyes: Negative.   Respiratory: Negative.  Cardiovascular: Negative.   Gastrointestinal: Negative.   Genitourinary: Negative.   Musculoskeletal: Negative.   Skin: Negative.   Neurological: Negative.   Endo/Heme/Allergies: Negative.   Psychiatric/Behavioral: Negative.     PHYSICAL EXAMINATION:    BP 122/70 (BP Location: Right Arm, Patient Position: Sitting, Cuff Size: Normal)   Pulse 84   Resp 14   Wt 156 lb (70.8 kg)   LMP 10/31/2017   BMI 28.30 kg/m     General appearance: alert, cooperative and appears stated age Neck: no adenopathy, supple, symmetrical, trachea midline and thyroid normal to inspection and palpation Heart: regular rate and rhythm Lungs: CTAB Abdomen: soft, non-tender; bowel sounds normal; no masses,  no organomegaly Extremities: normal, atraumatic, no cyanosis Skin: normal color, texture and turgor, no rashes or lesions Lymph: normal cervical supraclavicular and inguinal nodes Neurologic: grossly  normal   ASSESSMENT Menometrorrhagia Endometrial polyp ?Degenerating fibroid, enlarging    PLAN Plan: hysteroscopy, polypectomy, dilation and curettage. Reviewed risks, including: bleeding, infection, uterine perforation, fluid overload, need for further sugery F/U ultrasound in 3/19 Not a candidate for OCP's Previously we discussed the mirena IUD to control her cycles will discuss more postoperatively   An After Visit Summary was printed and given to the patient.

## 2017-12-01 NOTE — H&P (Signed)
GYNECOLOGY  VISIT   HPI: 37 y.o.   Married  Caucasian  female   G2P2002 with Patient's last menstrual period was 10/31/2017.   here for surgery consult. Patient is scheduled for D& C hysteroscopy 12/12/17  The patient presented with AUB and pelvic cramping. Normal TSH, CBC and negative genprobe. Ultrasound with possible degenerating fibroid (subserosal) and sonohysterogram with endometrial polyp.    GYNECOLOGIC HISTORY: Patient's last menstrual period was 10/31/2017. Contraception:vasectomy  Menopausal hormone therapy: none                 OB History    Gravida Para Term Preterm AB Living   2 2 2     2    SAB TAB Ectopic Multiple Live Births           2             Patient Active Problem List   Diagnosis Date Noted  . Hypothyroidism   . Migraine with aura   . Unspecified hypothyroidism 05/13/2013        Past Medical History:  Diagnosis Date  . Environmental allergies   . Hypothyroidism   . Migraine with aura   . PVC (premature ventricular contraction)          Past Surgical History:  Procedure Laterality Date  . CESAREAN SECTION     x2          Current Outpatient Medications  Medication Sig Dispense Refill  . fluticasone (FLONASE) 50 MCG/ACT nasal spray Place into both nostrils daily.    Marland Kitchen levocetirizine (XYZAL) 5 MG tablet     . levothyroxine (SYNTHROID, LEVOTHROID) 75 MCG tablet Take 75 mcg by mouth daily before breakfast.    . metoprolol tartrate (LOPRESSOR) 25 MG tablet 25 mg. Half tab twice daily    . Multiple Vitamin (MULTIVITAMIN) tablet Take 1 tablet by mouth daily.    Marland Kitchen topiramate (TOPAMAX) 100 MG tablet Take 150 mg by mouth daily.    . valACYclovir (VALTREX) 1000 MG tablet TAKE 2 TABLETS BY MOUTH EVERY 12 HOURS AS NEEDED  2   No current facility-administered medications for this visit.      ALLERGIES: Patient has no known allergies.       Family History  Problem Relation Age of Onset  . Heart attack  Mother   . Hypertension Mother   . Kidney cancer Father        also in other parts of body now  . Diabetes Maternal Grandmother        Type 1    Social History        Socioeconomic History  . Marital status: Married    Spouse name: Not on file  . Number of children: Not on file  . Years of education: Not on file  . Highest education level: Not on file  Social Needs  . Financial resource strain: Not on file  . Food insecurity - worry: Not on file  . Food insecurity - inability: Not on file  . Transportation needs - medical: Not on file  . Transportation needs - non-medical: Not on file  Occupational History  . Not on file  Tobacco Use  . Smoking status: Never Smoker  . Smokeless tobacco: Never Used  Substance and Sexual Activity  . Alcohol use: No  . Drug use: No  . Sexual activity: Yes    Partners: Male    Birth control/protection: Surgical    Comment: Husband has Vasectomy   Other Topics Concern  .  Not on file  Social History Narrative  . Not on file    Review of Systems  Constitutional: Negative.   HENT: Negative.   Eyes: Negative.   Respiratory: Negative.   Cardiovascular: Negative.   Gastrointestinal: Negative.   Genitourinary: Negative.   Musculoskeletal: Negative.   Skin: Negative.   Neurological: Negative.   Endo/Heme/Allergies: Negative.   Psychiatric/Behavioral: Negative.     PHYSICAL EXAMINATION:    BP 122/70 (BP Location: Right Arm, Patient Position: Sitting, Cuff Size: Normal)   Pulse 84   Resp 14   Wt 156 lb (70.8 kg)   LMP 10/31/2017   BMI 28.30 kg/m     General appearance: alert, cooperative and appears stated age Neck: no adenopathy, supple, symmetrical, trachea midline and thyroid normal to inspection and palpation Heart: regular rate and rhythm Lungs: CTAB Abdomen: soft, non-tender; bowel sounds normal; no masses,  no organomegaly Extremities: normal, atraumatic, no cyanosis Skin: normal color, texture and  turgor, no rashes or lesions Lymph: normal cervical supraclavicular and inguinal nodes Neurologic: grossly normal   ASSESSMENT Menometrorrhagia Endometrial polyp ?Degenerating fibroid, enlarging    PLAN Plan: hysteroscopy, polypectomy, dilation and curettage. Reviewed risks, including: bleeding, infection, uterine perforation, fluid overload, need for further sugery F/U ultrasound in 3/19 Not a candidate for OCP's Previously we discussed the mirena IUD to control her cycles will discuss more postoperatively   An After Visit Summary was printed and given to the patient.

## 2017-12-02 NOTE — Patient Instructions (Addendum)
Your procedure is scheduled on: Monday December 12, 2017 at 11:30 am  Enter through the Main Entrance of [facility] at: 10:00 Pick up the phone at the desk and dial [phone].  Call this number if you have problems the morning of surgery: [phone].  Remember: Do NOT eat food: after Midnight on Sunday February 24 DO NOT drink any liquids: after 5:30 am day of surgery  Take these medicines the morning of surgery with a SIP OF WATER: Synthroid, Metoprolol, Topamax, (Valtrex, Nasacort spray as needed)  STOP ALL VITAMINS AND SUPPLEMENTS 1 WEEK PRIOR TO SURGERY  DO NOT SMOKE DAY OF SURGERY  Do NOT wear jewelry (body piercing), metal hair clips/bobby pins, make-up, or nail polish. Do NOT wear lotions, powders, or perfumes.  You may wear deoderant. Do NOT shave for 48 hours prior to surgery. Do NOT bring valuables to the hospital. Contacts, dentures, or bridgework may not be worn into surgery.  Have a responsible adult drive you home and stay with you for 24 hours after your procedure

## 2017-12-05 ENCOUNTER — Telehealth: Payer: Self-pay | Admitting: Obstetrics and Gynecology

## 2017-12-05 NOTE — Telephone Encounter (Signed)
Routing to [place] and Dr.Jertson for review.

## 2017-12-05 NOTE — Telephone Encounter (Signed)
Patient just found out that she has the flu and is wondering if she will still be able to have her surgery on Monday 12/12/17?

## 2017-12-05 NOTE — Telephone Encounter (Signed)
Dr. Talbert Nan to review.   Cc- Dr. Talbert Nan

## 2017-12-06 NOTE — Telephone Encounter (Signed)
As long as she is better, she should be fine proceeding with surgery. If she isn't feeling better by the end of this week she should call.

## 2017-12-06 NOTE — Telephone Encounter (Signed)
Spoke with patient. Advised of message as seen below from [place]. Patient verbalizes understanding. Will call back at the end of the week with an update.  Routing to provider for final review. Patient agreeable to disposition. Will close encounter.

## 2017-12-09 ENCOUNTER — Other Ambulatory Visit: Payer: Self-pay

## 2017-12-09 ENCOUNTER — Encounter (HOSPITAL_COMMUNITY)
Admission: RE | Admit: 2017-12-09 | Discharge: 2017-12-09 | Disposition: A | Discharge status: Home / Self Care | Payer: 59 | Source: Ambulatory Visit | Attending: Obstetrics and Gynecology | Admitting: Obstetrics and Gynecology

## 2017-12-09 ENCOUNTER — Encounter (HOSPITAL_COMMUNITY): Payer: Self-pay

## 2017-12-09 DIAGNOSIS — N939 Abnormal uterine and vaginal bleeding, unspecified: Secondary | ICD-10-CM | POA: Diagnosis not present

## 2017-12-09 DIAGNOSIS — I493 Ventricular premature depolarization: Secondary | ICD-10-CM | POA: Diagnosis not present

## 2017-12-09 DIAGNOSIS — Z79899 Other long term (current) drug therapy: Secondary | ICD-10-CM | POA: Diagnosis not present

## 2017-12-09 DIAGNOSIS — E039 Hypothyroidism, unspecified: Secondary | ICD-10-CM | POA: Diagnosis not present

## 2017-12-09 DIAGNOSIS — N921 Excessive and frequent menstruation with irregular cycle: Secondary | ICD-10-CM | POA: Diagnosis not present

## 2017-12-09 DIAGNOSIS — G43109 Migraine with aura, not intractable, without status migrainosus: Secondary | ICD-10-CM | POA: Diagnosis not present

## 2017-12-09 DIAGNOSIS — N840 Polyp of corpus uteri: Secondary | ICD-10-CM | POA: Diagnosis not present

## 2017-12-09 DIAGNOSIS — Z79890 Hormone replacement therapy (postmenopausal): Secondary | ICD-10-CM | POA: Diagnosis not present

## 2017-12-09 DIAGNOSIS — B009 Herpesviral infection, unspecified: Secondary | ICD-10-CM | POA: Diagnosis not present

## 2017-12-09 DIAGNOSIS — Z7951 Long term (current) use of inhaled steroids: Secondary | ICD-10-CM | POA: Diagnosis not present

## 2017-12-09 HISTORY — DX: Cardiac arrhythmia, unspecified: I49.9

## 2017-12-09 HISTORY — DX: Influenza due to unidentified influenza virus with other respiratory manifestations: J11.1

## 2017-12-09 LAB — CBC
HCT: 37.8 % (ref 36.0–46.0)
HEMOGLOBIN: 13.1 g/dL (ref 12.0–15.0)
MCH: 31.3 pg (ref 26.0–34.0)
MCHC: 34.7 g/dL (ref 30.0–36.0)
MCV: 90.4 fL (ref 78.0–100.0)
PLATELETS: 215 10*3/uL (ref 150–400)
RBC: 4.18 MIL/uL (ref 3.87–5.11)
RDW: 13.5 % (ref 11.5–15.5)
WBC: 4.2 10*3/uL (ref 4.0–10.5)

## 2017-12-09 LAB — BASIC METABOLIC PANEL
Anion gap: 9 (ref 5–15)
BUN: 10 mg/dL (ref 6–20)
CHLORIDE: 105 mmol/L (ref 101–111)
CO2: 21 mmol/L — ABNORMAL LOW (ref 22–32)
CREATININE: 0.71 mg/dL (ref 0.44–1.00)
Calcium: 9.2 mg/dL (ref 8.9–10.3)
GFR calc Af Amer: >60 mL/min (ref >60)
GFR calc non Af Amer: >60 mL/min (ref >60)
GLUCOSE: 79 mg/dL (ref 65–99)
POTASSIUM: 3.6 mmol/L (ref 3.5–5.1)
Sodium: 135 mmol/L (ref 135–145)

## 2017-12-09 NOTE — Pre-Procedure Instructions (Signed)
Dr. Royce Macadamia viewed and okay 'd EKG

## 2017-12-12 ENCOUNTER — Encounter (HOSPITAL_COMMUNITY)
Admission: RE | Disposition: A | Discharge status: Home / Self Care | Payer: Self-pay | Source: Ambulatory Visit | Attending: Obstetrics and Gynecology

## 2017-12-12 ENCOUNTER — Ambulatory Visit (HOSPITAL_COMMUNITY): Payer: 59 | Admitting: Anesthesiology

## 2017-12-12 ENCOUNTER — Other Ambulatory Visit: Payer: Self-pay

## 2017-12-12 ENCOUNTER — Ambulatory Visit (HOSPITAL_COMMUNITY)
Admission: RE | Admit: 2017-12-12 | Discharge: 2017-12-12 | Disposition: A | Discharge status: Home / Self Care | Payer: 59 | Source: Ambulatory Visit | Attending: Obstetrics and Gynecology | Admitting: Obstetrics and Gynecology

## 2017-12-12 ENCOUNTER — Encounter (HOSPITAL_COMMUNITY): Payer: Self-pay | Admitting: Anesthesiology

## 2017-12-12 DIAGNOSIS — I493 Ventricular premature depolarization: Secondary | ICD-10-CM | POA: Insufficient documentation

## 2017-12-12 DIAGNOSIS — G43109 Migraine with aura, not intractable, without status migrainosus: Secondary | ICD-10-CM | POA: Insufficient documentation

## 2017-12-12 DIAGNOSIS — N840 Polyp of corpus uteri: Secondary | ICD-10-CM | POA: Insufficient documentation

## 2017-12-12 DIAGNOSIS — Z79899 Other long term (current) drug therapy: Secondary | ICD-10-CM | POA: Insufficient documentation

## 2017-12-12 DIAGNOSIS — N921 Excessive and frequent menstruation with irregular cycle: Secondary | ICD-10-CM | POA: Insufficient documentation

## 2017-12-12 DIAGNOSIS — Z79890 Hormone replacement therapy (postmenopausal): Secondary | ICD-10-CM | POA: Insufficient documentation

## 2017-12-12 DIAGNOSIS — Z7951 Long term (current) use of inhaled steroids: Secondary | ICD-10-CM | POA: Insufficient documentation

## 2017-12-12 DIAGNOSIS — B009 Herpesviral infection, unspecified: Secondary | ICD-10-CM | POA: Insufficient documentation

## 2017-12-12 DIAGNOSIS — N939 Abnormal uterine and vaginal bleeding, unspecified: Secondary | ICD-10-CM | POA: Insufficient documentation

## 2017-12-12 DIAGNOSIS — E039 Hypothyroidism, unspecified: Secondary | ICD-10-CM | POA: Insufficient documentation

## 2017-12-12 DIAGNOSIS — N920 Excessive and frequent menstruation with regular cycle: Secondary | ICD-10-CM | POA: Diagnosis not present

## 2017-12-12 DIAGNOSIS — R102 Pelvic and perineal pain: Secondary | ICD-10-CM | POA: Diagnosis not present

## 2017-12-12 HISTORY — PX: DILATATION & CURETTAGE/HYSTEROSCOPY WITH MYOSURE: SHX6511

## 2017-12-12 LAB — PREGNANCY, URINE: PREG TEST UR: NEGATIVE

## 2017-12-12 SURGERY — DILATATION & CURETTAGE/HYSTEROSCOPY WITH MYOSURE
Anesthesia: General | Site: Vagina

## 2017-12-12 MED ORDER — KETOROLAC TROMETHAMINE 30 MG/ML IJ SOLN
INTRAMUSCULAR | Status: AC
  Filled 2017-12-12: qty 1

## 2017-12-12 MED ORDER — DEXAMETHASONE SODIUM PHOSPHATE 10 MG/ML IJ SOLN
INTRAMUSCULAR | Status: DC | PRN
  Administered 2017-12-12: 10 mg via INTRAVENOUS

## 2017-12-12 MED ORDER — LIDOCAINE HCL (CARDIAC) 20 MG/ML IV SOLN
INTRAVENOUS | Status: AC
  Filled 2017-12-12: qty 5

## 2017-12-12 MED ORDER — FENTANYL CITRATE (PF) 100 MCG/2ML IJ SOLN
INTRAMUSCULAR | Status: AC
  Filled 2017-12-12: qty 2

## 2017-12-12 MED ORDER — FENTANYL CITRATE (PF) 100 MCG/2ML IJ SOLN: 25.0000 ug | INTRAMUSCULAR | Status: DC | PRN

## 2017-12-12 MED ORDER — MIDAZOLAM HCL 2 MG/2ML IJ SOLN
INTRAMUSCULAR | Status: AC
  Filled 2017-12-12: qty 2

## 2017-12-12 MED ORDER — ONDANSETRON HCL 4 MG/2ML IJ SOLN
INTRAMUSCULAR | Status: DC | PRN
  Administered 2017-12-12: 4 mg via INTRAVENOUS

## 2017-12-12 MED ORDER — PROPOFOL 10 MG/ML IV BOLUS
INTRAVENOUS | Status: DC | PRN
  Administered 2017-12-12: 200 mg via INTRAVENOUS

## 2017-12-12 MED ORDER — MEPERIDINE HCL 25 MG/ML IJ SOLN: 6.2500 mg | INTRAMUSCULAR | Status: DC | PRN

## 2017-12-12 MED ORDER — DEXAMETHASONE SODIUM PHOSPHATE 10 MG/ML IJ SOLN
INTRAMUSCULAR | Status: AC
  Filled 2017-12-12: qty 1

## 2017-12-12 MED ORDER — KETOROLAC TROMETHAMINE 30 MG/ML IJ SOLN
INTRAMUSCULAR | Status: DC | PRN
  Administered 2017-12-12: 30 mg via INTRAVENOUS

## 2017-12-12 MED ORDER — HYDROCODONE-ACETAMINOPHEN 7.5-325 MG PO TABS: 1.0000 | ORAL_TABLET | Freq: Once | ORAL | Status: DC | PRN

## 2017-12-12 MED ORDER — PROPOFOL 10 MG/ML IV BOLUS
INTRAVENOUS | Status: AC
  Filled 2017-12-12: qty 20

## 2017-12-12 MED ORDER — SCOPOLAMINE 1 MG/3DAYS TD PT72
MEDICATED_PATCH | TRANSDERMAL | Status: AC
  Administered 2017-12-12: 1.5 mg via TRANSDERMAL
  Filled 2017-12-12: qty 1

## 2017-12-12 MED ORDER — METOCLOPRAMIDE HCL 5 MG/ML IJ SOLN: 10.0000 mg | Freq: Once | INTRAMUSCULAR | Status: DC | PRN

## 2017-12-12 MED ORDER — SODIUM CHLORIDE 0.9 % IR SOLN
Status: DC | PRN
  Administered 2017-12-12: 3000 mL

## 2017-12-12 MED ORDER — SCOPOLAMINE 1 MG/3DAYS TD PT72
1.0000 | MEDICATED_PATCH | Freq: Once | TRANSDERMAL | Status: DC
  Administered 2017-12-12: 1.5 mg via TRANSDERMAL

## 2017-12-12 MED ORDER — LACTATED RINGERS IV SOLN
INTRAVENOUS | Status: DC
  Administered 2017-12-12: 12:00:00 via INTRAVENOUS
  Administered 2017-12-12: 125 mL/h via INTRAVENOUS

## 2017-12-12 MED ORDER — LIDOCAINE HCL (CARDIAC) 20 MG/ML IV SOLN
INTRAVENOUS | Status: DC | PRN
  Administered 2017-12-12: 100 mg via INTRAVENOUS

## 2017-12-12 MED ORDER — MIDAZOLAM HCL 2 MG/2ML IJ SOLN
INTRAMUSCULAR | Status: DC | PRN
  Administered 2017-12-12: 2 mg via INTRAVENOUS

## 2017-12-12 MED ORDER — FENTANYL CITRATE (PF) 100 MCG/2ML IJ SOLN
INTRAMUSCULAR | Status: DC | PRN
  Administered 2017-12-12: 100 ug via INTRAVENOUS

## 2017-12-12 SURGICAL SUPPLY — 16 items
CANISTER SUCT 3000ML PPV (MISCELLANEOUS) ×3 IMPLANT
CATH ROBINSON RED A/P 16FR (CATHETERS) IMPLANT
DEVICE MYOSURE LITE (MISCELLANEOUS) IMPLANT
DEVICE MYOSURE REACH (MISCELLANEOUS) IMPLANT
FILTER ARTHROSCOPY CONVERTOR (FILTER) ×2 IMPLANT
GLOVE BIOGEL PI IND STRL 7.0 (GLOVE) ×2 IMPLANT
GLOVE BIOGEL PI INDICATOR 7.0 (GLOVE) ×2
GLOVE ECLIPSE 6.5 STRL STRAW (GLOVE) ×2 IMPLANT
GOWN STRL REUS W/TWL LRG LVL3 (GOWN DISPOSABLE) ×4 IMPLANT
PACK VAGINAL MINOR WOMEN LF (CUSTOM PROCEDURE TRAY) ×2 IMPLANT
PAD OB MATERNITY 4.3X12.25 (PERSONAL CARE ITEMS) ×2 IMPLANT
SEAL ROD LENS SCOPE MYOSURE (ABLATOR) ×2 IMPLANT
SYR 20CC LL (SYRINGE) IMPLANT
TOWEL OR 17X24 6PK STRL BLUE (TOWEL DISPOSABLE) ×4 IMPLANT
TUBING AQUILEX INFLOW (TUBING) ×2 IMPLANT
TUBING AQUILEX OUTFLOW (TUBING) ×2 IMPLANT

## 2017-12-12 NOTE — Anesthesia Procedure Notes (Signed)
Procedure Name: LMA Insertion Date/Time: 12/12/2017 11:06 AM Performed by: Genevie Ann, CRNA Pre-anesthesia Checklist: Patient identified, Emergency Drugs available, Suction available, Patient being monitored and Timeout performed Patient Re-evaluated:Patient Re-evaluated prior to induction Oxygen Delivery Method: Circle system utilized Preoxygenation: Pre-oxygenation with 100% oxygen Induction Type: IV induction LMA: LMA inserted LMA Size: 4.0 Number of attempts: 1 ETT to lip (cm): at lip. Dental Injury: Teeth and Oropharynx as per pre-operative assessment

## 2017-12-12 NOTE — Anesthesia Preprocedure Evaluation (Addendum)
Anesthesia Evaluation  Patient identified by MRN, date of birth, ID band Patient awake    Reviewed: Allergy & Precautions, NPO status , Patient's Chart, lab work & pertinent test results, reviewed documented beta blocker date and time   Airway Mallampati: II  TM Distance: >3 FB Neck ROM: Full    Dental no notable dental hx. (+) Teeth Intact   Pulmonary neg pulmonary ROS,    Pulmonary exam normal breath sounds clear to auscultation       Cardiovascular Normal cardiovascular exam+ dysrhythmias  Rhythm:Regular Rate:Normal  PVC's controlled with metoprolol   Neuro/Psych  Headaches, negative psych ROS   GI/Hepatic negative GI ROS, Neg liver ROS,   Endo/Other  Hypothyroidism   Renal/GU negative Renal ROS  negative genitourinary   Musculoskeletal negative musculoskeletal ROS (+)   Abdominal   Peds  Hematology   Anesthesia Other Findings   Reproductive/Obstetrics Menorrhagia Endometrial polyp HSV                            Anesthesia Physical Anesthesia Plan  ASA: II  Anesthesia Plan: General   Post-op Pain Management:    Induction: Intravenous  PONV Risk Score and Plan: 4 or greater and Midazolam, Propofol infusion, Dexamethasone, Ondansetron, Treatment may vary due to age or medical condition and Scopolamine patch - Pre-op  Airway Management Planned: LMA  Additional Equipment:   Intra-op Plan:   Post-operative Plan: Extubation in OR  Informed Consent: I have reviewed the patients History and Physical, chart, labs and discussed the procedure including the risks, benefits and alternatives for the proposed anesthesia with the patient or authorized representative who has indicated his/her understanding and acceptance.   Dental advisory given  Plan Discussed with: CRNA, Anesthesiologist and Surgeon  Anesthesia Plan Comments:       Anesthesia Quick Evaluation

## 2017-12-12 NOTE — Discharge Instructions (Signed)

## 2017-12-12 NOTE — Op Note (Signed)
Preoperative Diagnosis: abnormal uterine bleeding, endometrial polyp  Postoperative Diagnosis: abnormal uterine bleeding  Procedure: Hysteroscopy, dilation and curettage  Surgeon: Dr Sumner Boast  Assistants: None  Anesthesia: General via LMA  EBL: minimal  Fluids: 900 cc  Fluid deficit: 100 cc  Urine output: not recorded  Indications for surgery: The patient is a 37 yo female, who presented with abnormal uterine bleeding and pelvic cramping. Work up included a normal TSH, CBC, negative genprobe. Ultrasound showed a possible degenerating posterior subserosal fibroid and sonohysterogram was consistent with a small endometrial polyp.  The risks of the surgery were reviewed with the patient and the consent form was signed prior to her surgery.  Findings: EUA: mobile, slightly irregular uterus, no adnexal masses. Hysteroscopy: thickened endometrium, no polyps seen. Normal tubal ostia bilaterally   Specimens: Endometrial curettings    Procedure: The patient was taken to the operating room with an IV in place. She was placed in the dorsal lithotomy position and anesthesia was administered. She was prepped and draped in the usual sterile fashion for a vaginal procedure. She voided on the way to the OR. A weighted speculum was placed in the vagina and a single tooth tenaculum was placed on the anterior lip of the cervix. The cervix was dilated to a #7 hagar dilator. The uterus was sounded to 8 cm. The myosure hysteroscope was inserted into the uterine cavity. With continuous infusion of normal saline, the uterine cavity was visualized with the above findings. The myosure was then removed. The cavity was then curetted with the small sharp curette. The cavity had the characteristically gritty texture at the end of the procedure. The hysteroscope was reinserted into the uterine cavity, no abnormalities were noted. The curette and the single tooth tenaculum were removed. Oozing from the tenaculum  site was stopped with pressure. The speculum was removed. The patients perineum was cleansed of betadine and she was taken out of the dorsal lithotomy position.  Upon awakening the LMA was removed and the patient was transferred to the recovery room in stable and awake condition.  The sponge and instrument count were correct. There were no complications.

## 2017-12-12 NOTE — Anesthesia Postprocedure Evaluation (Signed)
Anesthesia Post Note  Patient: Christie Barron  Procedure(s) Performed: DILATATION & CURETTAGE/HYSTEROSCOPY (N/A Vagina )     Patient location during evaluation: PACU Anesthesia Type: General Level of consciousness: awake and alert Pain management: pain level controlled Vital Signs Assessment: post-procedure vital signs reviewed and stable Respiratory status: spontaneous breathing, nonlabored ventilation and respiratory function stable Cardiovascular status: blood pressure returned to baseline and stable Postop Assessment: no apparent nausea or vomiting Anesthetic complications: no    Last Vitals:  Vitals:   12/12/17 1230 12/12/17 1231  BP:  101/63  Pulse: 75 63  Resp: (!) 22 (!) 23  Temp:  36.8 C  SpO2: 100% 99%    Last Pain:  Vitals:   12/12/17 1231  TempSrc: Oral   Pain Goal: Patients Stated Pain Goal: 3 (12/12/17 0958)               Rockwell Zentz A.

## 2017-12-12 NOTE — Interval H&P Note (Signed)
History and Physical Interval Note:  12/12/2017 10:47 AM  Christie Barron  has presented today for surgery, with the diagnosis of menometorrhagia, endometrial polyp  The various methods of treatment have been discussed with the patient and family. After consideration of risks, benefits and other options for treatment, the patient has consented to  Procedure(s): [place] (N/A) as a surgical intervention .  The patient's history has been reviewed, patient examined, no change in status, stable for surgery.  I have reviewed the patient's chart and labs.  Questions were answered to the patient's satisfaction.     Salvadore Dom

## 2017-12-12 NOTE — Transfer of Care (Signed)
Immediate Anesthesia Transfer of Care Note  Patient: LASHAUNDRA LEHRMANN  Procedure(s) Performed: DILATATION & CURETTAGE/HYSTEROSCOPY (N/A Vagina )  Patient Location: PACU  Anesthesia Type:General  Level of Consciousness: awake, alert  and oriented  Airway & Oxygen Therapy: Patient Spontanous Breathing and Patient connected to nasal cannula oxygen  Post-op Assessment: Report given to RN and Post -op Vital signs reviewed and stable  Post vital signs: Reviewed and stable  Last Vitals:  Vitals:   12/12/17 0958  BP: 107/70  Pulse: 77  Resp: 16  Temp: 36.8 C  SpO2: 100%    Last Pain:  Vitals:   12/12/17 0958  TempSrc: Oral      Patients Stated Pain Goal: 3 (88/50/27 7412)  Complications: No apparent anesthesia complications

## 2017-12-13 ENCOUNTER — Encounter (HOSPITAL_COMMUNITY): Payer: Self-pay | Admitting: Obstetrics and Gynecology

## 2017-12-13 ENCOUNTER — Telehealth: Payer: Self-pay | Admitting: Obstetrics and Gynecology

## 2017-12-13 NOTE — Telephone Encounter (Signed)
1. Patient has questions about her surgery on 12/12/17. Specifically she want to know about if a fibroid was removed.  2. Patient wants to know if she can schedule her next ultrasound with her two week post op on 12/28/17.

## 2017-12-14 ENCOUNTER — Telehealth: Payer: Self-pay | Admitting: Certified Nurse Midwife

## 2017-12-14 MED ORDER — SCOPOLAMINE 1 MG/3DAYS TD PT72: 1.0000 | MEDICATED_PATCH | TRANSDERMAL | 0 refills | Status: DC

## 2017-12-14 NOTE — Telephone Encounter (Signed)
Dr.Jertson, okay to advise patient endometrial polyp was removed on 12/12/2017 and pathology returned with no evidence of hyperplasia or malignancy. Patient is asking to combine her post op and follow up ultrasound in March. Currently scheduled for a Wednesday and would have to be moved to accommodate both.

## 2017-12-14 NOTE — Telephone Encounter (Signed)
Spoke with patient. Patient would like another Scopolamine patch called into her pharmacy on file. Rx for Scopolamine patch place 1 patch every 3 days #1 0RF sent to pharmacy on file. Advised to contact the office if symptoms persist.   Routing to provider for final review. Patient agreeable to disposition. Will close encounter.

## 2017-12-14 NOTE — Telephone Encounter (Signed)
Patient returned call. There is another open phone note as well.

## 2017-12-14 NOTE — Telephone Encounter (Signed)
Yes you can combine her visits. Pathology was benign with polyp

## 2017-12-14 NOTE — Telephone Encounter (Signed)
You can call in another scopolamine patch if she wants. I would expect she would be feeling better. If another patch isn't helping she should call back.

## 2017-12-14 NOTE — Telephone Encounter (Signed)
Patient has some questions about the patch she was wearing after surgery DOS 12/12/17

## 2017-12-14 NOTE — Telephone Encounter (Signed)
Patient called requesting to speak with the nurse about nausea she is experiencing after surgery on 12/12/17.

## 2017-12-14 NOTE — Telephone Encounter (Signed)
Please see telephone call dated 12/13/2017. Encounter closed.

## 2017-12-14 NOTE — Telephone Encounter (Signed)
Spoke with patient. Advised of message as seen below from [place]. Patient verbalizes understanding. Post op appointment and PUS scheduled for 12/29/2017 at 12:30 pm with 1 pm appointment with Dr.Jertson. Patient is agreeable to date and time. Patient states that she took off her scopolamine patch yesterday and has had a lot of nausea today. Feels like she is going to vomit, but has not. Patient is bloated. Is eating small frequent meals and hydrating without relief. Asking what else she can do and if there is any medication she can take. Is not taking any pain medication. Also reports she is having pain/stiffness in both sides of her neck. Advised this is likely from positioning during her procedure.

## 2017-12-15 ENCOUNTER — Telehealth: Payer: Self-pay | Admitting: Obstetrics and Gynecology

## 2017-12-15 NOTE — Telephone Encounter (Signed)
Patient is post op and called to report she is experiencing a bloated stomach. She further said she weighed herself today and she weighs six pounds more than she did on Monday, 12/12/17, before she had surgery.  Patient had surgery on 12/12/17.

## 2017-12-15 NOTE — Telephone Encounter (Signed)
Spoke with patient. Nausea has improved, still present. States she feels bloated, has increased fluids. Tolerating fluids well and small, frequent meals. Is concerned about 6lb weight gain since surgery.   Abdomen as soft, spotting, voiding well. Last bowel movement today, normal.   Denies any swelling in extremities. No pain, has not taken any pain medication.  Advised patient can experience temporary weight gain d/t extra fluids during and after surgery. May experience bloating after surgery, this is not uncommon. Advised if abdomen becomes hard, distended, swelling develops or unable to have BM, return call to office. Patient verbalizes understanding and is agreeable.   Routing to provider for final review. Patient is agreeable to disposition. Will close encounter.

## 2017-12-28 ENCOUNTER — Ambulatory Visit: Payer: 59 | Admitting: Obstetrics and Gynecology

## 2017-12-29 ENCOUNTER — Ambulatory Visit (INDEPENDENT_AMBULATORY_CARE_PROVIDER_SITE_OTHER): Payer: 59

## 2017-12-29 ENCOUNTER — Ambulatory Visit (INDEPENDENT_AMBULATORY_CARE_PROVIDER_SITE_OTHER): Payer: 59 | Admitting: Obstetrics and Gynecology

## 2017-12-29 ENCOUNTER — Other Ambulatory Visit: Payer: Self-pay

## 2017-12-29 ENCOUNTER — Encounter: Payer: Self-pay | Admitting: Obstetrics and Gynecology

## 2017-12-29 VITALS — BP 118/58 | HR 72 | Resp 14 | Wt 150.0 lb

## 2017-12-29 DIAGNOSIS — D259 Leiomyoma of uterus, unspecified: Secondary | ICD-10-CM

## 2017-12-29 DIAGNOSIS — N939 Abnormal uterine and vaginal bleeding, unspecified: Secondary | ICD-10-CM | POA: Diagnosis not present

## 2017-12-29 DIAGNOSIS — Z9889 Other specified postprocedural states: Secondary | ICD-10-CM

## 2017-12-29 DIAGNOSIS — D252 Subserosal leiomyoma of uterus: Secondary | ICD-10-CM | POA: Diagnosis not present

## 2017-12-29 DIAGNOSIS — R102 Pelvic and perineal pain: Secondary | ICD-10-CM | POA: Diagnosis not present

## 2017-12-29 NOTE — Progress Notes (Signed)
GYNECOLOGY  VISIT   HPI: 37 y.o.   Married  Caucasian  female   G2P2002 with Patient's last menstrual period was 12/23/2017.   here for follow up pelvic U/S and F/U D&C hysteroscopy. Pathology was benign with a polyp. Prior to the hysteroscopy she was bleeding every 2-4 weeks x 4-10 days, normal to heavy flow. At times saturating a tampon in 30 min.      She has had problems with hormonal contraception in the past, doesn't want hormones or an IUD. She bleed for 6 months on depo-provera, bleed with the nuvaring. She has migraines with aura and isn't a candidate for OCP's.  She hasn't been anemic, normal TSH.   GYNECOLOGIC HISTORY: Patient's last menstrual period was 12/23/2017. Contraception:vacsectomy Menopausal hormone therapy: none         OB History    Gravida Para Term Preterm AB Living   2 2 2     2    SAB TAB Ectopic Multiple Live Births           2         Patient Active Problem List   Diagnosis Date Noted  . Hypothyroidism   . Migraine with aura   . Unspecified hypothyroidism 05/13/2013    Past Medical History:  Diagnosis Date  . Dysrhythmia    PVC'S  . Environmental allergies   . Flu    FINISHED TAMIFLU 2/22  . Hypothyroidism   . Migraine with aura   . PVC (premature ventricular contraction)   . PVC (premature ventricular contraction)    YEARS AGO    Past Surgical History:  Procedure Laterality Date  . CESAREAN SECTION     x2  . DILATATION & CURETTAGE/HYSTEROSCOPY WITH MYOSURE N/A 12/12/2017   Procedure: DILATATION & CURETTAGE/HYSTEROSCOPY;  Surgeon: Salvadore Dom, MD;  Location: [facility];  Service: Gynecology;  Laterality: N/A;  . DILATION AND CURETTAGE OF UTERUS    . HYSTEROSCOPY    . WISDOM TOOTH EXTRACTION  2013    Current Outpatient Medications  Medication Sig Dispense Refill  . Cholecalciferol (VITAMIN D3) 2000 units TABS Take 2,000 Units by mouth daily.    Marland Kitchen levocetirizine (XYZAL) 5 MG tablet Take 5 mg by mouth at bedtime.     Marland Kitchen  levothyroxine (SYNTHROID, LEVOTHROID) 75 MCG tablet Take 75 mcg by mouth daily before breakfast.    . Lysine 1000 MG TABS Take 1,000 mg by mouth daily.    . metoprolol tartrate (LOPRESSOR) 25 MG tablet Take 12.5 mg by mouth 2 (two) times daily.     . Multiple Vitamin (MULTIVITAMIN) tablet Take 1 tablet by mouth daily.    Marland Kitchen topiramate (TOPAMAX) 100 MG tablet Take 100 mg by mouth 2 (two) times daily.     Marland Kitchen triamcinolone (NASACORT ALLERGY 24HR) 55 MCG/ACT AERO nasal inhaler Place 1 spray into the nose 2 (two) times daily.    . valACYclovir (VALTREX) 1000 MG tablet TAKE 2 TABLETS BY MOUTH EVERY 12 HOURS AS NEEDED COLD SORES.  2   No current facility-administered medications for this visit.      ALLERGIES: Patient has no known allergies.  Family History  Problem Relation Age of Onset  . Heart attack Mother   . Hypertension Mother   . Kidney cancer Father        also in other parts of body now  . Diabetes Maternal Grandmother        Type 1    Social History   Socioeconomic History  .  Marital status: Married    Spouse name: Not on file  . Number of children: Not on file  . Years of education: Not on file  . Highest education level: Not on file  Social Needs  . Financial resource strain: Not on file  . Food insecurity - worry: Not on file  . Food insecurity - inability: Not on file  . Transportation needs - medical: Not on file  . Transportation needs - non-medical: Not on file  Occupational History  . Not on file  Tobacco Use  . Smoking status: Never Smoker  . Smokeless tobacco: Never Used  Substance and Sexual Activity  . Alcohol use: No  . Drug use: No  . Sexual activity: Yes    Partners: Male    Birth control/protection: Surgical    Comment: Husband has Vasectomy   Other Topics Concern  . Not on file  Social History Narrative  . Not on file    Review of Systems  Constitutional: Negative.   HENT: Negative.   Eyes: Negative.   Respiratory: Negative.    Cardiovascular: Negative.   Gastrointestinal: Negative.   Genitourinary: Negative.   Musculoskeletal: Negative.   Skin: Negative.   Neurological: Negative.   Endo/Heme/Allergies: Negative.   Psychiatric/Behavioral: Negative.     PHYSICAL EXAMINATION:    BP (!) 118/58 (BP Location: Right Arm, Patient Position: Sitting, Cuff Size: Normal)   Pulse 72   Resp 14   Wt 150 lb (68 kg)   LMP 12/23/2017   BMI 27.44 kg/m     General appearance: alert, cooperative and appears stated age  Reviewed ultrasound images and surgery pictures  ASSESSMENT AUB, s/p hysteroscopy with D&C, pathology was benign with a polyp. Normal CBC and TSH She has a stable, degenerating fibroid, not in the cavity     PLAN Discussed options of doing nothing, mini-pill, depo-provera, mirena IUD, endometrial ablation and TLH. She is really not interested in any hormonal options.  She will calendar her cycles and f/u for her annual exam in 2 months Call with any questions   An After Visit Summary was printed and given to the patient.

## 2018-01-07 DIAGNOSIS — J029 Acute pharyngitis, unspecified: Secondary | ICD-10-CM | POA: Diagnosis not present

## 2018-01-18 DIAGNOSIS — E039 Hypothyroidism, unspecified: Secondary | ICD-10-CM | POA: Diagnosis not present

## 2018-01-18 DIAGNOSIS — J309 Allergic rhinitis, unspecified: Secondary | ICD-10-CM | POA: Diagnosis not present

## 2018-01-18 DIAGNOSIS — G43909 Migraine, unspecified, not intractable, without status migrainosus: Secondary | ICD-10-CM | POA: Diagnosis not present

## 2018-01-24 ENCOUNTER — Telehealth: Payer: Self-pay | Admitting: Obstetrics and Gynecology

## 2018-01-24 NOTE — Telephone Encounter (Signed)
Patient is having irregular cycles after having surgery in February.

## 2018-01-24 NOTE — Telephone Encounter (Signed)
Reviewed with Dr. Talbert Nan, call returned to patient.   1. OV scheduled for 4/10 at 4pm with Dr. Talbert Nan. Patient declined earlier appointment time offered.   2. ER precautions provided overnight -seek immediate care at [facility] for continued bleeding, changing saturated pad q1 hr, lightheadedness, dizziness, weakness, SOB, N/V, severe pain.    Patient verbalizes understanding.  Routing to provider for final review. Patient is agreeable to disposition. Will close encounter.

## 2018-01-24 NOTE — Telephone Encounter (Signed)
Spoke with patient. Patient states she is on her second menses since [facility] on 12/12/17. LMP 4/8, bleeding heavy since this morning, changing tampon and pad q1hr. HA since 4/8, "feels funny, can not describe".   Denies pain, SHOB, weakness, N/V, fever/chills.  Advised OV needed today for further evaluation of bleeding. Patient declined, states she is at work and can not leave until after 7pm. Patient states she discussed options with Dr. Talbert Nan at last [place] on 3/14, "would possibly like to proceed with hysterectomy". Advised will review with Dr. Talbert Nan and return call.

## 2018-01-25 ENCOUNTER — Encounter: Payer: Self-pay | Admitting: Obstetrics and Gynecology

## 2018-01-25 ENCOUNTER — Other Ambulatory Visit: Payer: Self-pay

## 2018-01-25 ENCOUNTER — Ambulatory Visit (INDEPENDENT_AMBULATORY_CARE_PROVIDER_SITE_OTHER): Payer: 59 | Admitting: Obstetrics and Gynecology

## 2018-01-25 VITALS — BP 118/82 | HR 80 | Resp 14 | Wt 151.0 lb

## 2018-01-25 DIAGNOSIS — N946 Dysmenorrhea, unspecified: Secondary | ICD-10-CM | POA: Diagnosis not present

## 2018-01-25 DIAGNOSIS — D252 Subserosal leiomyoma of uterus: Secondary | ICD-10-CM | POA: Diagnosis not present

## 2018-01-25 DIAGNOSIS — N920 Excessive and frequent menstruation with regular cycle: Secondary | ICD-10-CM | POA: Diagnosis not present

## 2018-01-25 NOTE — Progress Notes (Signed)
GYNECOLOGY  VISIT   HPI: 37 y.o.   Married  Caucasian  female   G2P2002 with Patient's last menstrual period was 01/23/2018.   here c/o heavy menstrual bleeding X 2 days     The patient underwent a hysteroscopy, polypectomy, D&C in 2/19 for AUB. Pathology was benign. She had a normal TSH, CBC and negative genprobe. She has a degenerating fibroid, not in her cavity. On f/u ultrasound last month the fibroid was stable at 3.8 cm. No other abnormalities noted.  She declines hormonal contraception or an IUD. She has migraines with aura and isn't a candidate for OCP's. Her husband has had a vasectomy.  She is on her second cycle since her D&C. The first cycle she passed bigger blood clots than normal. Her current cycle started one month later and has been much heavier than normal. Yesterday she had an 11 hour shift, was going to the bathroom every 45-60 minutes to change a super tampon (saturating through). Flow is normal today, saturating a super tampon every 2-2.5 hours. Cramps were bad yesterday.   GYNECOLOGIC HISTORY: Patient's last menstrual period was 01/23/2018. Contraception: vasectomy  Menopausal hormone therapy: none         OB History    Gravida  2   Para  2   Term  2   Preterm      AB      Living  2     SAB      TAB      Ectopic      Multiple      Live Births  2              Patient Active Problem List   Diagnosis Date Noted  . Hypothyroidism   . Migraine with aura   . Unspecified hypothyroidism 05/13/2013    Past Medical History:  Diagnosis Date  . Dysrhythmia    PVC'S  . Environmental allergies   . Flu    FINISHED TAMIFLU 2/22  . Hypothyroidism   . Migraine with aura   . PVC (premature ventricular contraction)   . PVC (premature ventricular contraction)    YEARS AGO    Past Surgical History:  Procedure Laterality Date  . CESAREAN SECTION     x2  . DILATATION & CURETTAGE/HYSTEROSCOPY WITH MYOSURE N/A 12/12/2017   Procedure: DILATATION &  CURETTAGE/HYSTEROSCOPY;  Surgeon: Salvadore Dom, MD;  Location: [facility];  Service: Gynecology;  Laterality: N/A;  . DILATION AND CURETTAGE OF UTERUS    . HYSTEROSCOPY    . WISDOM TOOTH EXTRACTION  2013    Current Outpatient Medications  Medication Sig Dispense Refill  . Cholecalciferol (VITAMIN D3) 2000 units TABS Take 2,000 Units by mouth daily.    Marland Kitchen levocetirizine (XYZAL) 5 MG tablet Take 5 mg by mouth at bedtime.     Marland Kitchen levothyroxine (SYNTHROID, LEVOTHROID) 75 MCG tablet Take 75 mcg by mouth daily before breakfast.    . Lysine 1000 MG TABS Take 1,000 mg by mouth daily.    . metoprolol tartrate (LOPRESSOR) 25 MG tablet Take 12.5 mg by mouth 2 (two) times daily.     . Multiple Vitamin (MULTIVITAMIN) tablet Take 1 tablet by mouth daily.    Marland Kitchen topiramate (TOPAMAX) 100 MG tablet Take 100 mg by mouth 2 (two) times daily.     Marland Kitchen triamcinolone (NASACORT ALLERGY 24HR) 55 MCG/ACT AERO nasal inhaler Place 1 spray into the nose 2 (two) times daily.    . valACYclovir (VALTREX) 1000 MG  tablet TAKE 2 TABLETS BY MOUTH EVERY 12 HOURS AS NEEDED COLD SORES.  2   No current facility-administered medications for this visit.      ALLERGIES: Patient has no known allergies.  Family History  Problem Relation Age of Onset  . Heart attack Mother   . Hypertension Mother   . Kidney cancer Father        also in other parts of body now  . Diabetes Maternal Grandmother        Type 1    Social History   Socioeconomic History  . Marital status: Married    Spouse name: Not on file  . Number of children: Not on file  . Years of education: Not on file  . Highest education level: Not on file  Occupational History  . Not on file  Social Needs  . Financial resource strain: Not on file  . Food insecurity:    Worry: Not on file    Inability: Not on file  . Transportation needs:    Medical: Not on file    Non-medical: Not on file  Tobacco Use  . Smoking status: Never Smoker  . Smokeless tobacco:  Never Used  Substance and Sexual Activity  . Alcohol use: No  . Drug use: No  . Sexual activity: Yes    Partners: Male    Birth control/protection: Surgical    Comment: Husband has Vasectomy   Lifestyle  . Physical activity:    Days per week: Not on file    Minutes per session: Not on file  . Stress: Not on file  Relationships  . Social connections:    Talks on phone: Not on file    Gets together: Not on file    Attends religious service: Not on file    Active member of club or organization: Not on file    Attends meetings of clubs or organizations: Not on file    Relationship status: Not on file  . Intimate partner violence:    Fear of current or ex partner: Not on file    Emotionally abused: Not on file    Physically abused: Not on file    Forced sexual activity: Not on file  Other Topics Concern  . Not on file  Social History Narrative  . Not on file    Review of Systems  Constitutional: Negative.   HENT: Negative.   Eyes: Negative.   Respiratory: Negative.   Cardiovascular: Negative.   Gastrointestinal: Negative.   Genitourinary:       Heavy menstrual bleeding   Musculoskeletal: Negative.   Skin: Negative.   Neurological: Negative.   Endo/Heme/Allergies: Negative.   Psychiatric/Behavioral: Negative.     PHYSICAL EXAMINATION:    BP 118/82 (BP Location: Right Arm, Patient Position: Sitting, Cuff Size: Normal)   Pulse 80   Resp 14   Wt 151 lb (68.5 kg)   LMP 01/23/2018   BMI 27.62 kg/m     General appearance: alert, cooperative and appears stated age  ASSESSMENT Menorrhagia, dysmenorrhea s/p hysteroscopy, polypectomy and D&C. She can't take OCP's, declines progesterone only contraception.    PLAN Hgb 12.2 Discussed option of do nothing, mirena IUD, endometrial ablation and hysterectomy. Discussed the risks of laparoscopic hysterectomy and recovery.  Given information on the mirena IUD All of her questions were answered She will consider her options  and call back   An After Visit Summary was printed and given to the patient.  ~20 minutes face to face time  of which over 50% was spent in counseling.

## 2018-01-31 ENCOUNTER — Telehealth: Payer: Self-pay | Admitting: Obstetrics and Gynecology

## 2018-01-31 NOTE — Telephone Encounter (Signed)
Patient says she is ready to schedule surgery.

## 2018-01-31 NOTE — Telephone Encounter (Addendum)
Return call to patient. States she is ready to proeced with hysterectomy.  Desires to schedule close to end of May due to insurance issues.  Date options discussed. Patient will consider dates and call back.   Per office visit note on 01-25-18, discussed TLH option with Dr Talbert Nan. Routing to provider to confirm scheduling instructions.

## 2018-02-01 NOTE — Telephone Encounter (Signed)
Please set her up for a TLH/BS/cystoscopy. Menorrhagia, dysmenorrhea, fibroid uterus Estimated time is 2 hours.

## 2018-02-09 NOTE — Telephone Encounter (Signed)
Call placed to patient to review benefits for recommended procedure. Left voicemail requesting a return call.    cc: Lamont Snowball, RN

## 2018-02-14 DIAGNOSIS — R070 Pain in throat: Secondary | ICD-10-CM | POA: Diagnosis not present

## 2018-02-14 DIAGNOSIS — J309 Allergic rhinitis, unspecified: Secondary | ICD-10-CM | POA: Diagnosis not present

## 2018-02-22 ENCOUNTER — Ambulatory Visit: Payer: 59 | Admitting: Obstetrics and Gynecology

## 2018-02-27 NOTE — Telephone Encounter (Signed)
Follow-up call to patient. Per ROI can leave message on voice mail which has name confirmation "Christie Barron."  Left message calling for update on plans for surgery. Request call back to [place] or Gay Filler with update on plan.

## 2018-03-02 DIAGNOSIS — D225 Melanocytic nevi of trunk: Secondary | ICD-10-CM | POA: Diagnosis not present

## 2018-03-02 DIAGNOSIS — L814 Other melanin hyperpigmentation: Secondary | ICD-10-CM | POA: Diagnosis not present

## 2018-03-02 DIAGNOSIS — L821 Other seborrheic keratosis: Secondary | ICD-10-CM | POA: Diagnosis not present

## 2018-03-03 ENCOUNTER — Ambulatory Visit: Payer: 59 | Admitting: Certified Nurse Midwife

## 2018-03-09 NOTE — Telephone Encounter (Signed)
No response from patient regarding plans for surgery. Encounter closed.

## 2018-07-24 DIAGNOSIS — E039 Hypothyroidism, unspecified: Secondary | ICD-10-CM | POA: Diagnosis not present

## 2018-07-24 DIAGNOSIS — G43909 Migraine, unspecified, not intractable, without status migrainosus: Secondary | ICD-10-CM | POA: Diagnosis not present

## 2018-07-24 DIAGNOSIS — J309 Allergic rhinitis, unspecified: Secondary | ICD-10-CM | POA: Diagnosis not present

## 2018-07-28 ENCOUNTER — Emergency Department (HOSPITAL_BASED_OUTPATIENT_CLINIC_OR_DEPARTMENT_OTHER)
Admission: EM | Admit: 2018-07-28 | Discharge: 2018-07-28 | Disposition: A | Discharge status: Home / Self Care | Payer: 59 | Attending: Emergency Medicine | Admitting: Emergency Medicine

## 2018-07-28 ENCOUNTER — Other Ambulatory Visit: Payer: Self-pay

## 2018-07-28 ENCOUNTER — Emergency Department (HOSPITAL_BASED_OUTPATIENT_CLINIC_OR_DEPARTMENT_OTHER): Payer: 59

## 2018-07-28 ENCOUNTER — Encounter (HOSPITAL_BASED_OUTPATIENT_CLINIC_OR_DEPARTMENT_OTHER): Payer: Self-pay | Admitting: *Deleted

## 2018-07-28 DIAGNOSIS — R070 Pain in throat: Secondary | ICD-10-CM | POA: Diagnosis not present

## 2018-07-28 DIAGNOSIS — E039 Hypothyroidism, unspecified: Secondary | ICD-10-CM | POA: Insufficient documentation

## 2018-07-28 DIAGNOSIS — J385 Laryngeal spasm: Secondary | ICD-10-CM | POA: Diagnosis present

## 2018-07-28 DIAGNOSIS — R51 Headache: Secondary | ICD-10-CM | POA: Diagnosis not present

## 2018-07-28 DIAGNOSIS — Z79899 Other long term (current) drug therapy: Secondary | ICD-10-CM | POA: Insufficient documentation

## 2018-07-28 DIAGNOSIS — G43909 Migraine, unspecified, not intractable, without status migrainosus: Secondary | ICD-10-CM | POA: Diagnosis not present

## 2018-07-28 LAB — CBC WITH DIFFERENTIAL/PLATELET
Abs Immature Granulocytes: 0.01 10*3/uL (ref 0.00–0.07)
BASOS PCT: 0 %
Basophils Absolute: 0 10*3/uL (ref 0.0–0.1)
EOS ABS: 0 10*3/uL (ref 0.0–0.5)
EOS PCT: 1 %
HEMATOCRIT: 40.1 % (ref 36.0–46.0)
Hemoglobin: 13.3 g/dL (ref 12.0–15.0)
Immature Granulocytes: 0 %
LYMPHS ABS: 1.3 10*3/uL (ref 0.7–4.0)
Lymphocytes Relative: 23 %
MCH: 30.8 pg (ref 26.0–34.0)
MCHC: 33.2 g/dL (ref 30.0–36.0)
MCV: 92.8 fL (ref 80.0–100.0)
MONOS PCT: 8 %
Monocytes Absolute: 0.5 10*3/uL (ref 0.1–1.0)
Neutro Abs: 3.9 10*3/uL (ref 1.7–7.7)
Neutrophils Relative %: 68 %
Platelets: 243 10*3/uL (ref 150–400)
RBC: 4.32 MIL/uL (ref 3.87–5.11)
RDW: 13.2 % (ref 11.5–15.5)
WBC: 5.7 10*3/uL (ref 4.0–10.5)
nRBC: 0 % (ref 0.0–0.2)

## 2018-07-28 LAB — BASIC METABOLIC PANEL
ANION GAP: 8 (ref 5–15)
BUN: 14 mg/dL (ref 6–20)
CHLORIDE: 107 mmol/L (ref 98–111)
CO2: 24 mmol/L (ref 22–32)
Calcium: 8.8 mg/dL — ABNORMAL LOW (ref 8.9–10.3)
Creatinine, Ser: 0.84 mg/dL (ref 0.44–1.00)
GFR calc Af Amer: >60 mL/min (ref >60)
GFR calc non Af Amer: >60 mL/min (ref >60)
GLUCOSE: 95 mg/dL (ref 70–99)
Potassium: 3.4 mmol/L — ABNORMAL LOW (ref 3.5–5.1)
Sodium: 139 mmol/L (ref 135–145)

## 2018-07-28 LAB — PREGNANCY, URINE: Preg Test, Ur: NEGATIVE

## 2018-07-28 MED ORDER — LORAZEPAM 1 MG PO TABS: 1.0000 mg | ORAL_TABLET | Freq: Once | ORAL | Status: DC

## 2018-07-28 MED ORDER — METHOCARBAMOL 500 MG PO TABS: 500.0000 mg | ORAL_TABLET | Freq: Three times a day (TID) | ORAL | 0 refills | Status: DC | PRN

## 2018-07-28 NOTE — ED Notes (Signed)
Patient refused to take Ativan at this time.  She stated that she wants to wait for lab results.

## 2018-07-28 NOTE — ED Notes (Signed)
ED Provider at bedside. 

## 2018-07-28 NOTE — ED Provider Notes (Signed)
[facility] DEPARTMENT Provider Note   CSN: [id number] Arrival date & time: 07/28/18  1922     History   Chief Complaint Chief Complaint  Patient presents with  . Headache    HPI Christie Barron is a 37 y.o. female.  The history is provided by the patient. No language interpreter was used.  Headache       37 year old female with history of hypothyroidism, migraine, resenting complaining of throat spasm.  Patient report for the past week she has had recurrent headache.  She described headache as a sharp tender sensation affecting the left side of her head that was waxing waning and felt slightly different from her usual migraine headache.  Headache is mostly resolved however today, she noticed an uncomfortable sensation to the back of her throat as well as having spasm involving the left side of her neck and throat.  Spasm has been recurrent throughout the day which concerns her.  She has never had this problem before.  She denies any associated fever, neck stiffness, light or sound sensitivity, trouble swallowing, nausea, vomiting, diarrhea, chest pain or shortness of breath.  No specific treatment tried for the spasm but states she has tried multiple over-the-counter medication to help her with her headache.  She is currently on Topamax for headache.  Past Medical History:  Diagnosis Date  . Dysrhythmia    PVC'S  . Environmental allergies   . Flu    FINISHED TAMIFLU 2/22  . Hypothyroidism   . Migraine with aura   . PVC (premature ventricular contraction)   . PVC (premature ventricular contraction)    YEARS AGO    Patient Active Problem List   Diagnosis Date Noted  . Hypothyroidism   . Migraine with aura   . Unspecified hypothyroidism 05/13/2013    Past Surgical History:  Procedure Laterality Date  . CESAREAN SECTION     x2  . DILATATION & CURETTAGE/HYSTEROSCOPY WITH MYOSURE N/A 12/12/2017   Procedure: DILATATION & CURETTAGE/HYSTEROSCOPY;   Surgeon: Salvadore Dom, MD;  Location: [facility];  Service: Gynecology;  Laterality: N/A;  . DILATION AND CURETTAGE OF UTERUS    . HYSTEROSCOPY    . WISDOM TOOTH EXTRACTION  2013     OB History    Gravida  2   Para  2   Term  2   Preterm      AB      Living  2     SAB      TAB      Ectopic      Multiple      Live Births  2            Home Medications    Prior to Admission medications   Medication Sig Start Date End Date Taking? Authorizing Provider  Cholecalciferol (VITAMIN D3) 2000 units TABS Take 2,000 Units by mouth daily.    [provider]  levocetirizine (XYZAL) 5 MG tablet Take 5 mg by mouth at bedtime.  12/31/15   [provider]  levothyroxine (SYNTHROID, LEVOTHROID) 75 MCG tablet Take 75 mcg by mouth daily before breakfast.    [provider]  Lysine 1000 MG TABS Take 1,000 mg by mouth daily.    [provider]  metoprolol tartrate (LOPRESSOR) 25 MG tablet Take 12.5 mg by mouth 2 (two) times daily.     [provider]  Multiple Vitamin (MULTIVITAMIN) tablet Take 1 tablet by mouth daily.    [provider]  topiramate (TOPAMAX) 100 MG tablet Take 100 mg by mouth 2 (two) times daily.     [provider]  triamcinolone (NASACORT ALLERGY 24HR) 55 MCG/ACT AERO nasal inhaler Place 1 spray into the nose 2 (two) times daily.    [provider]  valACYclovir (VALTREX) 1000 MG tablet TAKE 2 TABLETS BY MOUTH EVERY 12 HOURS AS NEEDED COLD SORES. 12/31/16   [provider]    Family History Family History  Problem Relation Age of Onset  . Heart attack Mother   . Hypertension Mother   . Kidney cancer Father        also in other parts of body now  . Diabetes Maternal Grandmother        Type 1    Social History Social History   Tobacco Use  . Smoking status: Never Smoker  . Smokeless tobacco: Never Used  Substance Use Topics  . Alcohol use: No  . Drug use: No      Allergies   Patient has no known allergies.   Review of Systems Review of Systems  Neurological: Positive for headaches.  All other systems reviewed and are negative.    Physical Exam Updated Vital Signs BP 130/72 (BP Location: Left Arm)   Pulse 82   Temp 98.3 F (36.8 C) (Oral)   Resp 18   Ht 5\' 2"  (1.575 m)   Wt 70.3 kg   LMP 07/20/2018   SpO2 100%   BMI 28.35 kg/m   Physical Exam  Constitutional: She is oriented to person, place, and time. She appears well-developed and well-nourished. No distress.  HENT:  Head: Atraumatic.  Mouth/Throat: Oropharynx is clear and moist.  Normal phonation.  Eyes: Pupils are equal, round, and reactive to light. Conjunctivae and EOM are normal.  Neck: Normal range of motion. Neck supple. No neck rigidity.  Cardiovascular: Normal rate and regular rhythm.  Pulmonary/Chest: Effort normal and breath sounds normal.  Abdominal: Soft. There is no tenderness.  Musculoskeletal: Normal range of motion.  Neurological: She is alert and oriented to person, place, and time. She has normal strength. She displays normal reflexes. She displays a negative Romberg sign. GCS eye subscore is 4. GCS verbal subscore is 5. GCS motor subscore is 6.  Skin: Skin is warm. No rash noted.  Psychiatric: Her speech is normal. Her mood appears anxious.  Nursing note and vitals reviewed.    ED Treatments / Results  Labs (all labs ordered are listed, but only abnormal results are displayed) Labs Reviewed  BASIC METABOLIC PANEL - Abnormal; Notable for the following components:      Result Value   Potassium 3.4 (*)    Calcium 8.8 (*)    All other components within normal limits  CBC WITH DIFFERENTIAL/PLATELET  PREGNANCY, URINE    EKG None  Radiology Ct Head Wo Contrast  Result Date: 07/28/2018 CLINICAL DATA:  Headaches for 1 week. Left-sided facial pain. Left eye blurry off and on. EXAM: CT HEAD WITHOUT CONTRAST TECHNIQUE: Contiguous axial images  were obtained from the base of the skull through the vertex without intravenous contrast. COMPARISON:  MRI brain 03/27/2015. CT head 12/12/2012. FINDINGS: Brain: No evidence of acute infarction, hemorrhage, hydrocephalus, extra-axial collection or mass lesion/mass effect. Vascular: No hyperdense vessel or unexpected calcification. Skull: Normal. Negative for fracture or focal lesion. Sinuses/Orbits: No acute finding. Other: None. IMPRESSION: Normal head CT. Electronically Signed   By: Lucienne Capers M.D.   On: 07/28/2018 22:21    Procedures Procedures (including critical  care time)  Medications Ordered in ED Medications  LORazepam (ATIVAN) tablet 1 mg (has no administration in time range)     Initial Impression / Assessment and Plan / ED Course  I have reviewed the triage vital signs and the nursing notes.  Pertinent labs & imaging results that were available during my care of the patient were reviewed by me and considered in my medical decision making (see chart for details).     BP 130/72 (BP Location: Left Arm)   Pulse 82   Temp 98.3 F (36.8 C) (Oral)   Resp 18   Ht 5\' 2"  (1.575 m)   Wt 70.3 kg   LMP 07/20/2018   SpO2 100%   BMI 28.35 kg/m    Final Clinical Impressions(s) / ED Diagnoses   Final diagnoses:  Throat discomfort    ED Discharge Orders         Ordered    methocarbamol (ROBAXIN) 500 MG tablet  Every 8 hours PRN     07/28/18 2253         8:46 PM Patient with history of migraine presenting with left-sided headache for the past week however the headache is mostly resolved.  Of primary concern is having spasm to the back of her throat affecting the left side which started today.  This patient symptom is not consistent with a stroke.  She does not have any significant neck pain to suggest dissection.  No signs of infection were noted.  Patient however does appears to be tearful and anxious.  Plan to obtain head CT scan, check basic labs.  Care discussed with  Dr. Ralene Bathe.  10:48 PM Pregnancy test is negative, normal WBC, normal H&H, electrolytes panel are reassuring, head CT scan unremarkable.  On reexamination, patient is resting comfortably but still report having the same spasm and fasciculation to the back of her throat.  She has normal phonation, she tolerates the saliva without difficulty and there are no evidence of airway compromise.  No appreciate fasciculation or muscle spasm noted on my repeat exam.  Recommend patient to follow-up with PCP however I will also provide an ear nose and throat specialist for further outpatient evaluation.   Domenic Moras, PA-C 07/28/18 2254    Quintella Reichert, MD 07/29/18 [phone]

## 2018-07-28 NOTE — ED Notes (Signed)
Patient transported to CT 

## 2018-07-28 NOTE — ED Notes (Signed)
PA at bedside discussing results with patient at this time. 

## 2018-07-28 NOTE — Discharge Instructions (Addendum)
Please call and follow up with ENT specialist for further evaluation of your throat discomfort.  Take Robaxin as needed for muscle spasm. Return if you have any concerns.

## 2018-07-28 NOTE — ED Triage Notes (Signed)
Pt c/o h/a x 1 week, " tongue twitches"

## 2020-01-04 ENCOUNTER — Encounter: Payer: Self-pay | Admitting: Certified Nurse Midwife

## 2020-02-22 ENCOUNTER — Other Ambulatory Visit: Payer: Self-pay

## 2020-02-25 ENCOUNTER — Ambulatory Visit (INDEPENDENT_AMBULATORY_CARE_PROVIDER_SITE_OTHER): Payer: 59 | Admitting: Obstetrics and Gynecology

## 2020-02-25 ENCOUNTER — Other Ambulatory Visit: Payer: Self-pay

## 2020-02-25 ENCOUNTER — Encounter: Payer: Self-pay | Admitting: Obstetrics and Gynecology

## 2020-02-25 ENCOUNTER — Other Ambulatory Visit (HOSPITAL_COMMUNITY)
Admission: RE | Admit: 2020-02-25 | Discharge: 2020-02-25 | Disposition: A | Discharge status: Home / Self Care | Payer: 59 | Source: Ambulatory Visit | Attending: Obstetrics and Gynecology | Admitting: Obstetrics and Gynecology

## 2020-02-25 VITALS — BP 122/66 | HR 95 | Temp 97.9°F | Ht 62.25 in | Wt 158.0 lb

## 2020-02-25 DIAGNOSIS — Z01419 Encounter for gynecological examination (general) (routine) without abnormal findings: Secondary | ICD-10-CM

## 2020-02-25 DIAGNOSIS — N888 Other specified noninflammatory disorders of cervix uteri: Secondary | ICD-10-CM

## 2020-02-25 DIAGNOSIS — N920 Excessive and frequent menstruation with regular cycle: Secondary | ICD-10-CM | POA: Diagnosis not present

## 2020-02-25 DIAGNOSIS — Z124 Encounter for screening for malignant neoplasm of cervix: Secondary | ICD-10-CM | POA: Insufficient documentation

## 2020-02-25 DIAGNOSIS — K5900 Constipation, unspecified: Secondary | ICD-10-CM

## 2020-02-25 NOTE — Patient Instructions (Addendum)
Magnesium 500 mg a day can help with constipation.  EXERCISE AND DIET:  We recommended that you start or continue a regular exercise program for good health. Regular exercise means any activity that makes your heart beat faster and makes you sweat.  We recommend exercising at least 30 minutes per day at least 3 days a week, preferably 4 or 5.  We also recommend a diet low in fat and sugar.  Inactivity, poor dietary choices and obesity can cause diabetes, heart attack, stroke, and kidney damage, among others.    ALCOHOL AND SMOKING:  Women should limit their alcohol intake to no more than 7 drinks/beers/glasses of wine (combined, not each!) per week. Moderation of alcohol intake to this level decreases your risk of breast cancer and liver damage. And of course, no recreational drugs are part of a healthy lifestyle.  And absolutely no smoking or even second hand smoke. Most people know smoking can cause heart and lung diseases, but did you know it also contributes to weakening of your bones? Aging of your skin?  Yellowing of your teeth and nails?  CALCIUM AND VITAMIN D:  Adequate intake of calcium and Vitamin D are recommended.  The recommendations for exact amounts of these supplements seem to change often, but generally speaking 1,000 mg of calcium (between diet and supplement) and 800 units of Vitamin D per day seems prudent. Certain women may benefit from higher intake of Vitamin D.  If you are among these women, your doctor will have told you during your visit.    PAP SMEARS:  Pap smears, to check for cervical cancer or precancers,  have traditionally been done yearly, although recent scientific advances have shown that most women can have pap smears less often.  However, every woman still should have a physical exam from her gynecologist every year. It will include a breast check, inspection of the vulva and vagina to check for abnormal growths or skin changes, a visual exam of the cervix, and then an  exam to evaluate the size and shape of the uterus and ovaries.  And after 39 years of age, a rectal exam is indicated to check for rectal cancers. We will also provide age appropriate advice regarding health maintenance, like when you should have certain vaccines, screening for sexually transmitted diseases, bone density testing, colonoscopy, mammograms, etc.   MAMMOGRAMS:  All women over 56 years old should have a yearly mammogram. Many facilities now offer a "3D" mammogram, which may cost around $50 extra out of pocket. If possible,  we recommend you accept the option to have the 3D mammogram performed.  It both reduces the number of women who will be called back for extra views which then turn out to be normal, and it is better than the routine mammogram at detecting truly abnormal areas.    COLON CANCER SCREENING: Now recommend starting at age 66. At this time colonoscopy is not covered for routine screening until 50. There are take home tests that can be done between 45-49.   COLONOSCOPY:  Colonoscopy to screen for colon cancer is recommended for all women at age 57.  We know, you hate the idea of the prep.  We agree, BUT, having colon cancer and not knowing it is worse!!  Colon cancer so often starts as a polyp that can be seen and removed at colonscopy, which can quite literally save your life!  And if your first colonoscopy is normal and you have no family history of colon  cancer, most women don't have to have it again for 10 years.  Once every ten years, you can do something that may end up saving your life, right?  We will be happy to help you get it scheduled when you are ready.  Be sure to check your insurance coverage so you understand how much it will cost.  It may be covered as a preventative service at no cost, but you should check your particular policy.      Breast Self-Awareness Breast self-awareness means being familiar with how your breasts look and feel. It involves checking your  breasts regularly and reporting any changes to your health care provider. Practicing breast self-awareness is important. A change in your breasts can be a sign of a serious medical problem. Being familiar with how your breasts look and feel allows you to find any problems early, when treatment is more likely to be successful. All women should practice breast self-awareness, including women who have had breast implants. How to do a breast self-exam One way to learn what is normal for your breasts and whether your breasts are changing is to do a breast self-exam. To do a breast self-exam: Look for Changes  1. Remove all the clothing above your waist. 2. Stand in front of a mirror in a room with good lighting. 3. Put your hands on your hips. 4. Push your hands firmly downward. 5. Compare your breasts in the mirror. Look for differences between them (asymmetry), such as: ? Differences in shape. ? Differences in size. ? Puckers, dips, and bumps in one breast and not the other. 6. Look at each breast for changes in your skin, such as: ? Redness. ? Scaly areas. 7. Look for changes in your nipples, such as: ? Discharge. ? Bleeding. ? Dimpling. ? Redness. ? A change in position. Feel for Changes Carefully feel your breasts for lumps and changes. It is best to do this while lying on your back on the floor and again while sitting or standing in the shower or tub with soapy water on your skin. Feel each breast in the following way:  Place the arm on the side of the breast you are examining above your head.  Feel your breast with the other hand.  Start in the nipple area and make  inch (2 cm) overlapping circles to feel your breast. Use the pads of your three middle fingers to do this. Apply light pressure, then medium pressure, then firm pressure. The light pressure will allow you to feel the tissue closest to the skin. The medium pressure will allow you to feel the tissue that is a little deeper.  The firm pressure will allow you to feel the tissue close to the ribs.  Continue the overlapping circles, moving downward over the breast until you feel your ribs below your breast.  Move one finger-width toward the center of the body. Continue to use the  inch (2 cm) overlapping circles to feel your breast as you move slowly up toward your collarbone.  Continue the up and down exam using all three pressures until you reach your armpit.  Write Down What You Find  Write down what is normal for each breast and any changes that you find. Keep a written record with breast changes or normal findings for each breast. By writing this information down, you do not need to depend only on memory for size, tenderness, or location. Write down where you are in your menstrual cycle, if you  are still menstruating. If you are having trouble noticing differences in your breasts, do not get discouraged. With time you will become more familiar with the variations in your breasts and more comfortable with the exam. How often should I examine my breasts? Examine your breasts every month. If you are breastfeeding, the best time to examine your breasts is after a feeding or after using a breast pump. If you menstruate, the best time to examine your breasts is 5-7 days after your period is over. During your period, your breasts are lumpier, and it may be more difficult to notice changes. When should I see my health care provider? See your health care provider if you notice:  A change in shape or size of your breasts or nipples.  A change in the skin of your breast or nipples, such as a reddened or scaly area.  Unusual discharge from your nipples.  A lump or thick area that was not there before.  Pain in your breasts.  Anything that concerns you.   Hemorrhoids Hemorrhoids are swollen veins in and around the rectum or anus. There are two types of hemorrhoids:  Internal hemorrhoids. These occur in the veins  that are just inside the rectum. They may poke through to the outside and become irritated and painful.  External hemorrhoids. These occur in the veins that are outside the anus and can be felt as a painful swelling or hard lump near the anus. Most hemorrhoids do not cause serious problems, and they can be managed with home treatments such as diet and lifestyle changes. If home treatments do not help the symptoms, procedures can be done to shrink or remove the hemorrhoids. What are the causes? This condition is caused by increased pressure in the anal area. This pressure may result from various things, including:  Constipation.  Straining to have a bowel movement.  Diarrhea.  Pregnancy.  Obesity.  Sitting for long periods of time.  Heavy lifting or other activity that causes you to strain.  Anal sex.  Riding a bike for a long period of time. What are the signs or symptoms? Symptoms of this condition include:  Pain.  Anal itching or irritation.  Rectal bleeding.  Leakage of stool (feces).  Anal swelling.  One or more lumps around the anus. How is this diagnosed? This condition can often be diagnosed through a visual exam. Other exams or tests may also be done, such as:  An exam that involves feeling the rectal area with a gloved hand (digital rectal exam).  An exam of the anal canal that is done using a small tube (anoscope).  A blood test, if you have lost a significant amount of blood.  A test to look inside the colon using a flexible tube with a camera on the end (sigmoidoscopy or colonoscopy). How is this treated? This condition can usually be treated at home. However, various procedures may be done if dietary changes, lifestyle changes, and other home treatments do not help your symptoms. These procedures can help make the hemorrhoids smaller or remove them completely. Some of these procedures involve surgery, and others do not. Common procedures  include:  Rubber band ligation. Rubber bands are placed at the base of the hemorrhoids to cut off their blood supply.  Sclerotherapy. Medicine is injected into the hemorrhoids to shrink them.  Infrared coagulation. A type of light energy is used to get rid of the hemorrhoids.  Hemorrhoidectomy surgery. The hemorrhoids are surgically removed, and the veins  that supply them are tied off.  Stapled hemorrhoidopexy surgery. The surgeon staples the base of the hemorrhoid to the rectal wall. Follow these instructions at home: Eating and drinking   Eat foods that have a lot of fiber in them, such as whole grains, beans, nuts, fruits, and vegetables.  Ask your health care provider about taking products that have added fiber (fiber supplements).  Reduce the amount of fat in your diet. You can do this by eating low-fat dairy products, eating less red meat, and avoiding processed foods.  Drink enough fluid to keep your urine pale yellow. Managing pain and swelling   Take warm sitz baths for 20 minutes, 3-4 times a day to ease pain and discomfort. You may do this in a bathtub or using a portable sitz bath that fits over the toilet.  If directed, apply ice to the affected area. Using ice packs between sitz baths may be helpful. ? Put ice in a plastic bag. ? Place a towel between your skin and the bag. ? Leave the ice on for 20 minutes, 2-3 times a day. General instructions  Take over-the-counter and prescription medicines only as told by your health care provider.  Use medicated creams or suppositories as told.  Get regular exercise. Ask your health care provider how much and what kind of exercise is best for you. In general, you should do moderate exercise for at least 30 minutes on most days of the week (150 minutes each week). This can include activities such as walking, biking, or yoga.  Go to the bathroom when you have the urge to have a bowel movement. Do not wait.  Avoid straining  to have bowel movements.  Keep the anal area dry and clean. Use wet toilet paper or moist towelettes after a bowel movement.  Do not sit on the toilet for long periods of time. This increases blood pooling and pain.  Keep all follow-up visits as told by your health care provider. This is important. Contact a health care provider if you have:  Increasing pain and swelling that are not controlled by treatment or medicine.  Difficulty having a bowel movement, or you are unable to have a bowel movement.  Pain or inflammation outside the area of the hemorrhoids. Get help right away if you have:  Uncontrolled bleeding from your rectum. Summary  Hemorrhoids are swollen veins in and around the rectum or anus.  Most hemorrhoids can be managed with home treatments such as diet and lifestyle changes.  Taking warm sitz baths can help ease pain and discomfort.  In severe cases, procedures or surgery can be done to shrink or remove the hemorrhoids. This information is not intended to replace advice given to you by your health care provider. Make sure you discuss any questions you have with your health care provider. Document Revised: 03/02/2019 Document Reviewed: 02/23/2018 Elsevier Patient Education  [place].  About Constipation  Constipation Overview Constipation is the most common gastrointestinal complaint -- about 4 million Americans experience constipation and make 2.5 million physician visits a year to get help for the problem.  Constipation can occur when the colon absorbs too much water, the colon's muscle contraction is slow or sluggish, and/or there is delayed transit time through the colon.  The result is stool that is hard and dry.  Indicators of constipation include straining during bowel movements greater than 25% of the time, having fewer than three bowel movements per week, and/or the feeling of incomplete evacuation.  There are established guidelines ([place] II ) for  defining constipation. A person needs to have two or more of the following symptoms for at least 12 weeks (not necessarily consecutive) in the preceding 12 months: . Straining in  greater than 25% of bowel movements . Lumpy or hard stools in greater than 25% of bowel movements . Sensation of incomplete emptying in greater than 25% of bowel movements . Sensation of anorectal obstruction/blockade in greater than 25% of bowel movements . Manual maneuvers to help empty greater than 25% of bowel movements (e.g., digital evacuation, support of the pelvic floor)  . Less than  3 bowel movements/week . Loose stools are not present, and criteria for irritable bowel syndrome are insufficient  Common Causes of Constipation . Lack of fiber in your diet . Lack of physical activity . Medications, including iron and calcium supplements  . Dairy intake . Dehydration . Abuse of laxatives  Travel  Irritable Bowel Syndrome  Pregnancy  Luteal phase of menstruation (after ovulation and before menses)  Colorectal problems  Intestinal Dysfunction  Treating Constipation  There are several ways of treating constipation, including changes to diet and exercise, use of laxatives, adjustments to the pelvic floor, and scheduled toileting.  These treatments include: . increasing fiber and fluids in the diet  . increasing physical activity . learning muscle coordination   learning proper toileting techniques and toileting modifications   designing and sticking  to a toileting schedule     2007, [employer] Doc.22

## 2020-02-25 NOTE — Progress Notes (Signed)
39 y.o. G56P2002 Married White or Caucasian Not Hispanic or Latino female here for annual exam. Patient states that she has been having labia gets irritated for like a days or so and then goes away but always comes back. She also states that when putting in her flex cup one days she felt lumps on her cervix possibly.   Period Cycle (Days): 28 Period Duration (Days): 7-10 Period Pattern: Regular Menstrual Flow: Heavy Menstrual Control: Other (Comment)(Flex cups) Dysmenorrhea: (!) Mild(Headache severe) Dysmenorrhea Symptoms: Headache  She is doing well with the flex cup. Same as previously, but the flex cup makes it more tolerable.  She is getting blood work with her primary, thinks her iron stores were low, but not constipated previously.  Sexually active, always uncomfortable when he hits the cervix, positional. Tolerable.  Some baseline constipation, BM q1-3 days, has to strain, h/o hemorrhoids.   H/O hysteroscopy, polypectomy, D&C in 2/19 for AUB, benign pathology. H/O degenerating fibroid not in her cavity (3.8 cm). H/O migraine with aura. She has previously been counseled on mirena iud, ablation and hysterectomy.  Patient's last menstrual period was 02/14/2020.          Sexually active: Yes.    The current method of family planning is vasectomy.    Exercising: No.  The patient does not participate in regular exercise at present. Smoker:  no  Health Maintenance: Pap:  03/04/17 normal  History of abnormal Pap:  no TDaP:  2018 Gardasil: no   reports that she has never smoked. She has never used smokeless tobacco. She reports that she does not drink alcohol or use drugs. She works for [employer], Loss adjuster, chartered. Son is 9, daughter is almost 46.   Past Medical History:  Diagnosis Date  . Dysrhythmia    PVC'S  . Environmental allergies   . Flu    FINISHED TAMIFLU 2/22  . Hypothyroidism   . Migraine with aura   . PVC (premature ventricular contraction)   . PVC (premature ventricular  contraction)    YEARS AGO    Past Surgical History:  Procedure Laterality Date  . CESAREAN SECTION     x2  . DILATATION & CURETTAGE/HYSTEROSCOPY WITH MYOSURE N/A 12/12/2017   Procedure: DILATATION & CURETTAGE/HYSTEROSCOPY;  Surgeon: Salvadore Dom, MD;  Location: [facility];  Service: Gynecology;  Laterality: N/A;  . DILATION AND CURETTAGE OF UTERUS    . HYSTEROSCOPY    . WISDOM TOOTH EXTRACTION  2013    Current Outpatient Medications  Medication Sig Dispense Refill  . Cholecalciferol (VITAMIN D3) 2000 units TABS Take 2,000 Units by mouth daily.    Marland Kitchen levocetirizine (XYZAL) 5 MG tablet Take 5 mg by mouth at bedtime.     Marland Kitchen levothyroxine (SYNTHROID, LEVOTHROID) 75 MCG tablet Take 75 mcg by mouth daily before breakfast.    . Lysine 1000 MG TABS Take 1,000 mg by mouth daily.    . metoprolol tartrate (LOPRESSOR) 25 MG tablet Take 12.5 mg by mouth 2 (two) times daily.     . Multiple Vitamin (MULTIVITAMIN) tablet Take 1 tablet by mouth daily.    Marland Kitchen topiramate (TOPAMAX) 100 MG tablet Take 100 mg by mouth 2 (two) times daily.     Marland Kitchen triamcinolone (NASACORT ALLERGY 24HR) 55 MCG/ACT AERO nasal inhaler Place 1 spray into the nose 2 (two) times daily.    . valACYclovir (VALTREX) 1000 MG tablet TAKE 2 TABLETS BY MOUTH EVERY 12 HOURS AS NEEDED COLD SORES.  2   No current facility-administered medications  for this visit.  lopressor for PVC's  Family History  Problem Relation Age of Onset  . Heart attack Mother   . Hypertension Mother   . Kidney cancer Father        also in other parts of body now  . Diabetes Maternal Grandmother        Type 1    Review of Systems  Constitutional: Negative.   HENT: Negative.   Eyes: Negative.   Respiratory: Negative.   Cardiovascular: Negative.   Gastrointestinal: Negative.   Endocrine: Negative.   Genitourinary: Negative.   Musculoskeletal: Negative.   Skin: Negative.   Allergic/Immunologic: Negative.   Neurological: Negative.   Hematological:  Negative.   Psychiatric/Behavioral: Negative.     Exam:   BP 122/66   Pulse 95   Temp 97.9 F (36.6 C)   Ht 5' 2.25" (1.581 m)   Wt 158 lb (71.7 kg)   LMP 02/14/2020   SpO2 99%   BMI 28.67 kg/m   Weight change: @WEIGHTCHANGE @ Height:   Height: 5' 2.25" (158.1 cm)  Ht Readings from Last 3 Encounters:  02/25/20 5' 2.25" (1.581 m)  07/28/18 5\' 2"  (1.575 m)  12/09/17 5\' 2"  (1.575 m)    General appearance: alert, cooperative and appears stated age Head: Normocephalic, without obvious abnormality, atraumatic Neck: no adenopathy, supple, symmetrical, trachea midline and thyroid normal to inspection and palpation Lungs: clear to auscultation bilaterally Cardiovascular: regular rate and rhythm Breasts: normal appearance, no masses or tenderness Abdomen: soft, non-tender; non distended,  no masses,  no organomegaly Extremities: extremities normal, atraumatic, no cyanosis or edema Skin: Skin color, texture, turgor normal. No rashes or lesions Lymph nodes: Cervical, supraclavicular, and axillary nodes normal. No abnormal inguinal nodes palpated Neurologic: Grossly normal   Pelvic: External genitalia:  no lesions              Urethra:  normal appearing urethra with no masses, tenderness or lesions              Bartholins and Skenes: normal                 Vagina: normal appearing vagina with normal color and discharge, no lesions              Cervix: nabothian cyst and no lesions               Bimanual Exam:  Uterus:  normal size, contour, position, consistency, mobility, non-tender and anteverted              Adnexa: no mass, fullness, tenderness               Rectovaginal: Confirms               Anus:  normal sphincter tone, no lesions  Gae Dry chaperoned for the exam.  A:  Well Woman with normal exam  Long term menorrhagia, tolerable to her  Constipation (long term)  Reports intermittent vulvar irritation, not currently. Normal exam    P:   Pap with hpv  CBC and  Ferritin  Other labs with primary  Discussed breast self exam  Discussed calcium and vit D intake  Vulvar skin care information given  Discussed constipation  Can try daily Magnesium

## 2020-02-26 LAB — CBC
Hematocrit: 39 % (ref 34.0–46.6)
Hemoglobin: 13.2 g/dL (ref 11.1–15.9)
MCH: 31.1 pg (ref 26.6–33.0)
MCHC: 33.8 g/dL (ref 31.5–35.7)
MCV: 92 fL (ref 79–97)
Platelets: 250 10*3/uL (ref 150–450)
RBC: 4.24 x10E6/uL (ref 3.77–5.28)
RDW: 12.8 % (ref 11.7–15.4)
WBC: 6.3 10*3/uL (ref 3.4–10.8)

## 2020-02-26 LAB — CYTOLOGY - PAP
Comment: NEGATIVE
Diagnosis: NEGATIVE
High risk HPV: NEGATIVE

## 2020-02-26 LAB — FERRITIN: Ferritin: 10 ng/mL — ABNORMAL LOW (ref 15–150)

## 2020-09-03 IMAGING — CT CT HEAD W/O CM
3 series · 16 of 47 positions shown, 19 images · non-contrast
Comparison: MRI brain 03/27/2015. CT head 12/12/2012.

CLINICAL DATA: Headaches for 1 week. Left-sided facial pain. Left
eye blurry off and on.

EXAM:
CT HEAD WITHOUT CONTRAST
TECHNIQUE: Contiguous axial images were obtained from the base of the skull
through the vertex without intravenous contrast.

[Series 2: head wo · axial · 0.44mm/px · z∈[-126,+9]mm · 10 of 33 slices shown, 13 images]
[im 3/33  brain]
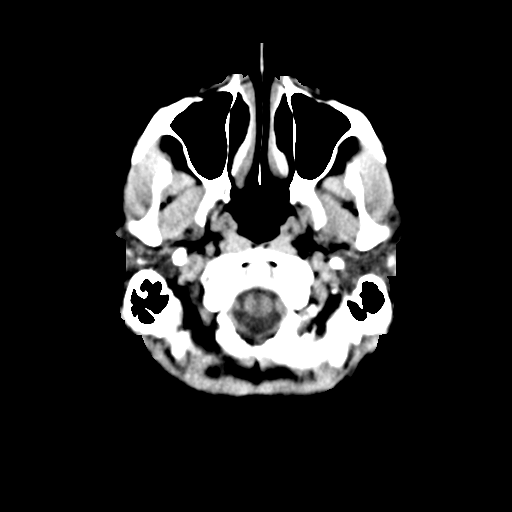
[im 3/33  bone]
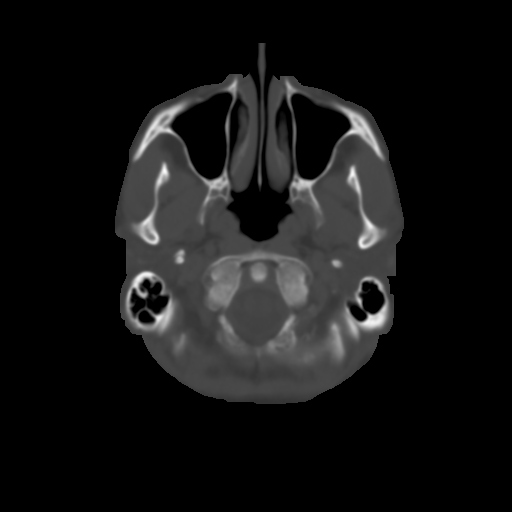
[im 6/33  brain]
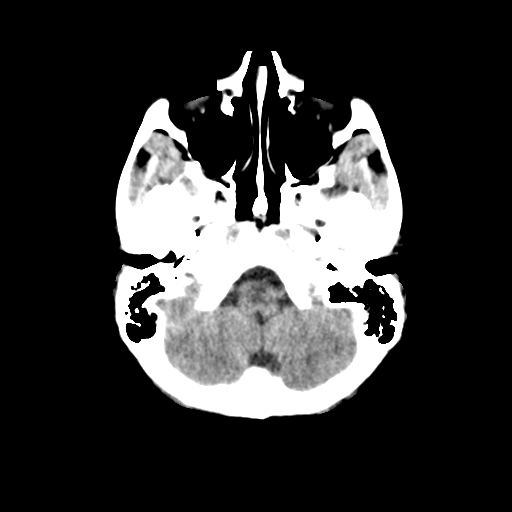
[im 9/33  brain]
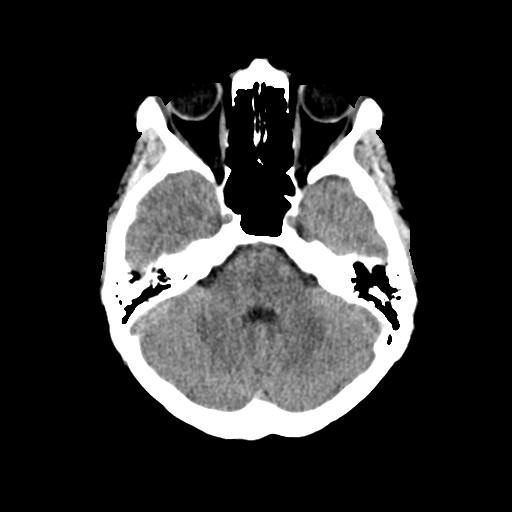
[im 12/33  brain]
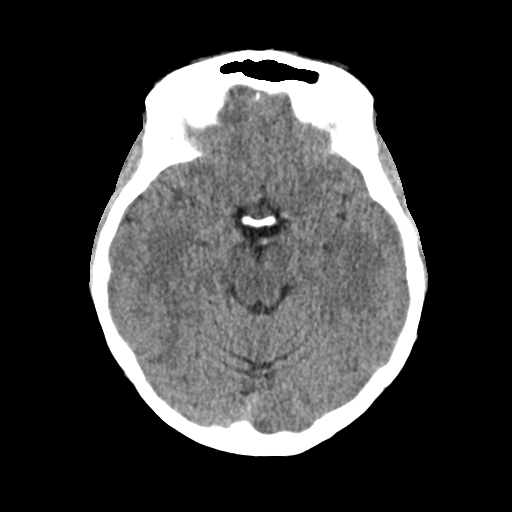
[im 15/33  brain]
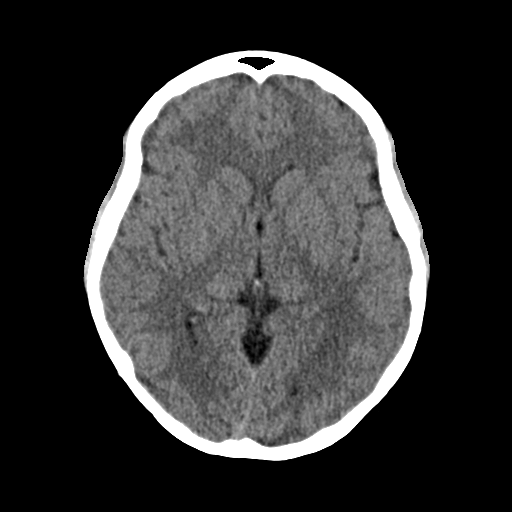
[im 15/33  bone]
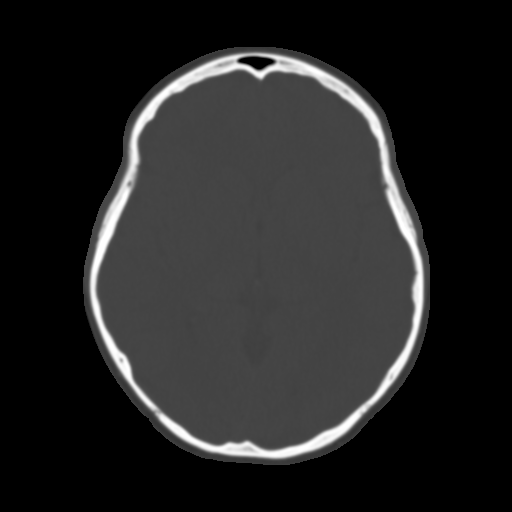
[im 18/33  brain]
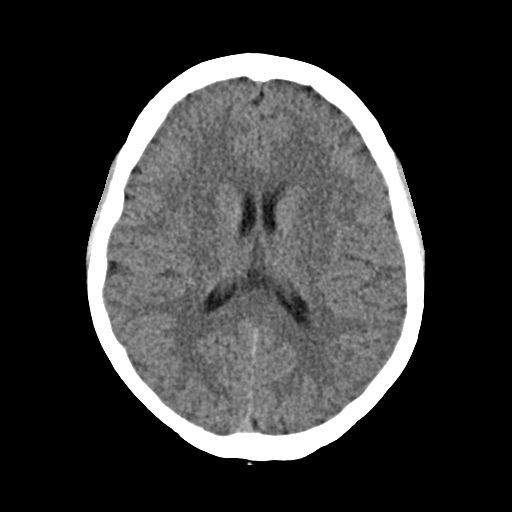
[im 21/33  brain]
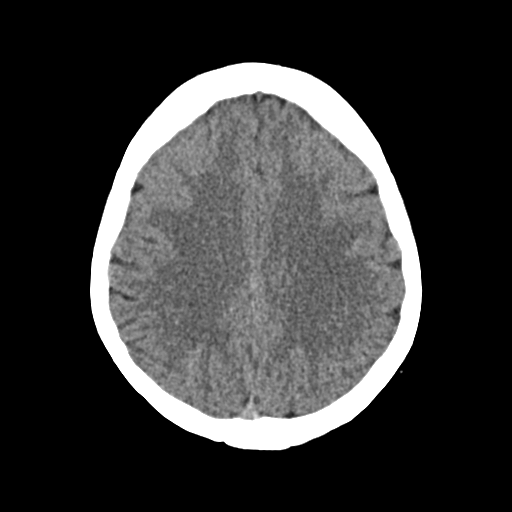
[im 25/33  brain]
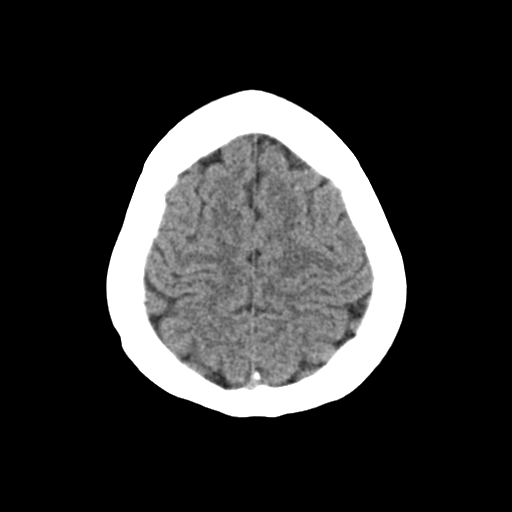
[im 27/33  brain]
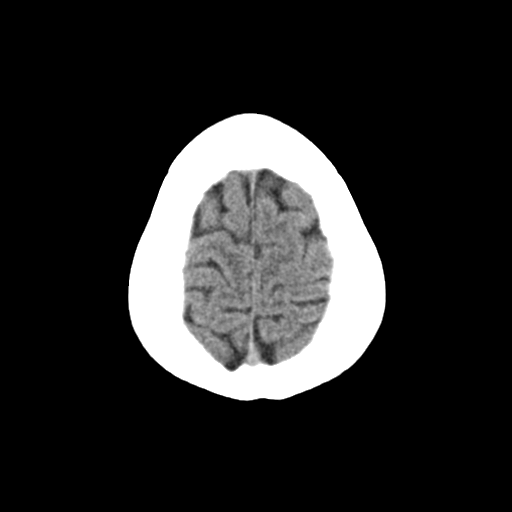
[im 27/33  bone]
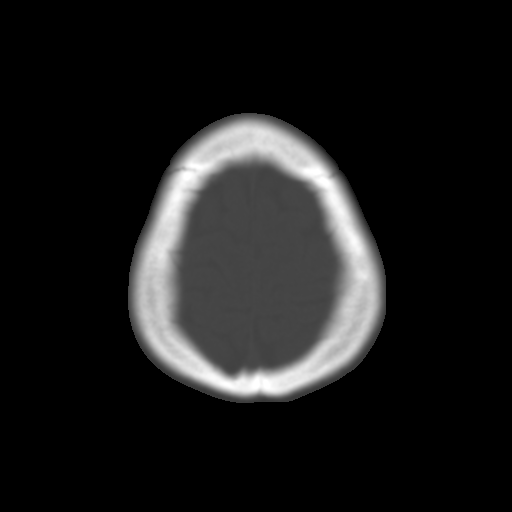
[im 30/33  brain]
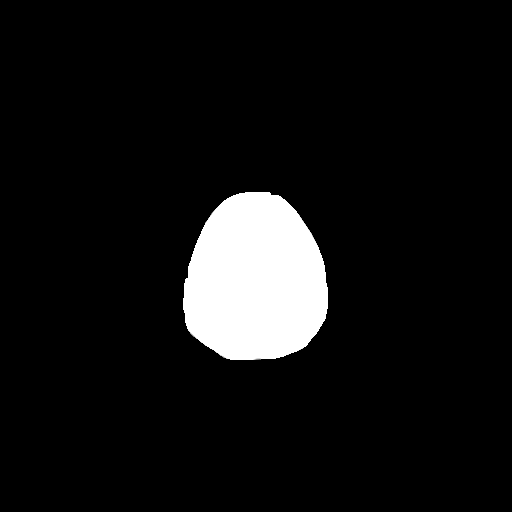

[Series 4: coronal soft · coronal · 0.31mm/px · 3 of 65 slices shown]
[im 22/65  brain]
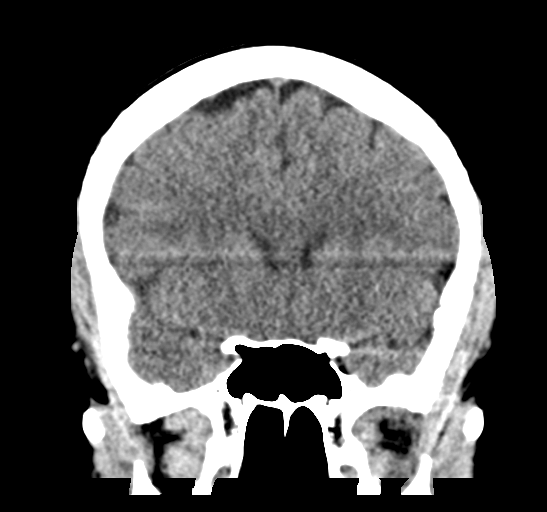
[im 29/65  brain]
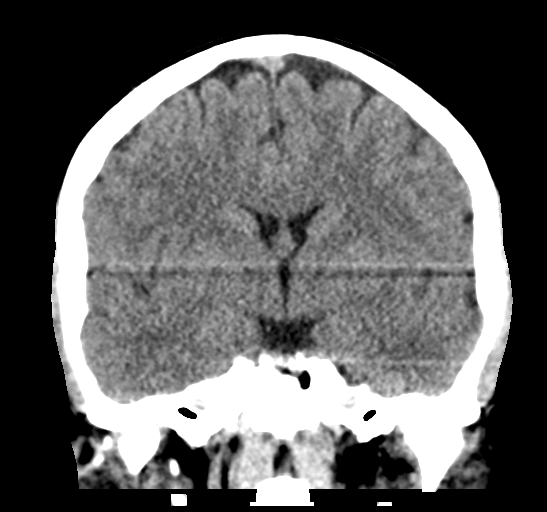
[im 36/65  brain]
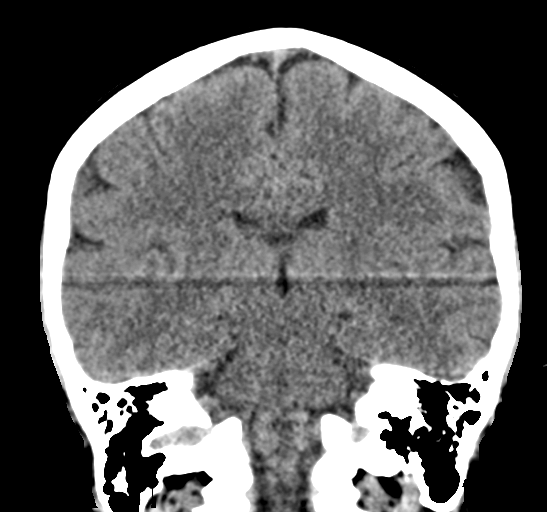

[Series 5: sag soft · sagittal · 0.31mm/px · 3 of 56 slices shown]
[im 19/56  brain]
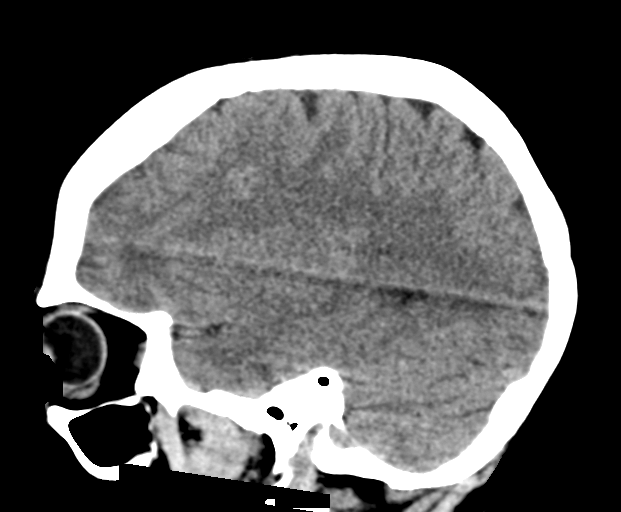
[im 28/56  brain]
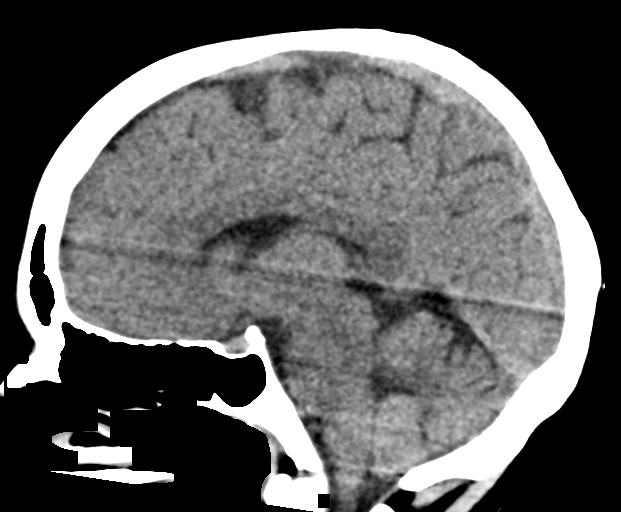
[im 37/56  brain]
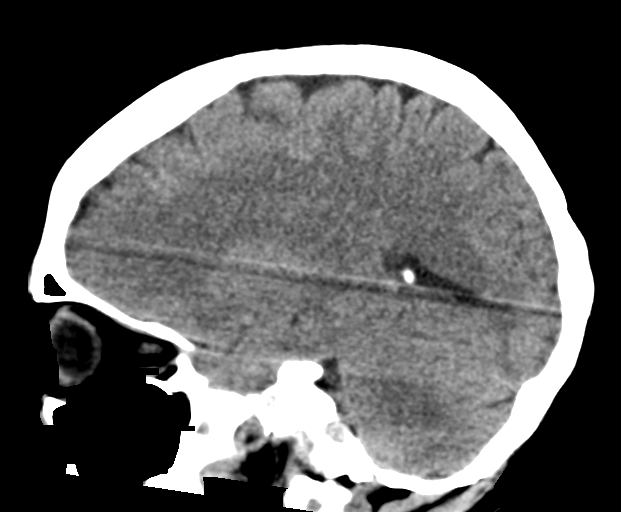

[16 of 47 positions shown; findings below may reference images not displayed]

FINDINGS: Brain: No evidence of acute infarction, hemorrhage, hydrocephalus,
extra-axial collection or mass lesion/mass effect.

Vascular: No hyperdense vessel or unexpected calcification.

Skull: Normal. Negative for fracture or focal lesion.

Sinuses/Orbits: No acute finding.

Other: None.
IMPRESSION: Normal head CT.

## 2021-02-23 NOTE — Progress Notes (Deleted)
40 y.o. G84P2002 Married White or Caucasian Not Hispanic or Latino female here for annual exam.      No LMP recorded.          Sexually active: {yes no:314532}  The current method of family planning is {contraception:315051}.    Exercising: {yes no:314532}  {types:19826} Smoker:  {YES [id number]  Health Maintenance: Pap:  03/04/17 normal  History of abnormal Pap:  no MMG:  None  BMD:   None  Colonoscopy: none  TDaP:  12/16/16  Gardasil: no   reports that she has never smoked. She has never used smokeless tobacco. She reports that she does not drink alcohol and does not use drugs.  Past Medical History:  Diagnosis Date  . Dysrhythmia    PVC'S  . Environmental allergies   . Flu    FINISHED TAMIFLU 2/22  . Hypothyroidism   . Migraine with aura   . PVC (premature ventricular contraction)   . PVC (premature ventricular contraction)    YEARS AGO    Past Surgical History:  Procedure Laterality Date  . CESAREAN SECTION     x2  . DILATATION & CURETTAGE/HYSTEROSCOPY WITH MYOSURE N/A 12/12/2017   Procedure: DILATATION & CURETTAGE/HYSTEROSCOPY;  Surgeon: Salvadore Dom, MD;  Location: [facility];  Service: Gynecology;  Laterality: N/A;  . DILATION AND CURETTAGE OF UTERUS    . HYSTEROSCOPY    . WISDOM TOOTH EXTRACTION  2013    Current Outpatient Medications  Medication Sig Dispense Refill  . Cholecalciferol (VITAMIN D3) 2000 units TABS Take 2,000 Units by mouth daily.    Marland Kitchen levocetirizine (XYZAL) 5 MG tablet Take 5 mg by mouth at bedtime.     Marland Kitchen levothyroxine (SYNTHROID, LEVOTHROID) 75 MCG tablet Take 75 mcg by mouth daily before breakfast.    . Lysine 1000 MG TABS Take 1,000 mg by mouth daily.    . metoprolol tartrate (LOPRESSOR) 25 MG tablet Take 12.5 mg by mouth 2 (two) times daily.     . Multiple Vitamin (MULTIVITAMIN) tablet Take 1 tablet by mouth daily.    Marland Kitchen topiramate (TOPAMAX) 100 MG tablet Take 100 mg by mouth 2 (two) times daily.     Marland Kitchen triamcinolone (NASACORT ALLERGY  24HR) 55 MCG/ACT AERO nasal inhaler Place 1 spray into the nose 2 (two) times daily.    . valACYclovir (VALTREX) 1000 MG tablet TAKE 2 TABLETS BY MOUTH EVERY 12 HOURS AS NEEDED COLD SORES.  2   No current facility-administered medications for this visit.    Family History  Problem Relation Age of Onset  . Heart attack Mother   . Hypertension Mother   . Kidney cancer Father        also in other parts of body now  . Diabetes Maternal Grandmother        Type 1    Review of Systems  Exam:   There were no vitals taken for this visit.  Weight change: @WEIGHTCHANGE @ Height:      Ht Readings from Last 3 Encounters:  02/25/20 5' 2.25" (1.581 m)  07/28/18 5\' 2"  (1.575 m)  12/09/17 5\' 2"  (1.575 m)    General appearance: alert, cooperative and appears stated age Head: Normocephalic, without obvious abnormality, atraumatic Neck: no adenopathy, supple, symmetrical, trachea midline and thyroid {CHL AMB PHY EX THYROID NORM DEFAULT:[phone]::"normal to inspection and palpation"} Lungs: clear to auscultation bilaterally Cardiovascular: regular rate and rhythm Breasts: {Exam; breast:13139::"normal appearance, no masses or tenderness"} Abdomen: soft, non-tender; non distended,  no masses,  no organomegaly  Extremities: extremities normal, atraumatic, no cyanosis or edema Skin: Skin color, texture, turgor normal. No rashes or lesions Lymph nodes: Cervical, supraclavicular, and axillary nodes normal. No abnormal inguinal nodes palpated Neurologic: Grossly normal   Pelvic: External genitalia:  no lesions              Urethra:  normal appearing urethra with no masses, tenderness or lesions              Bartholins and Skenes: normal                 Vagina: normal appearing vagina with normal color and discharge, no lesions              Cervix: {CHL AMB PHY EX CERVIX NORM [phone]::"no lesions"}               Bimanual Exam:  Uterus:  {CHL AMB PHY EX UTERUS NORM  DEFAULT:[phone]::"normal size, contour, position, consistency, mobility, non-tender"}              Adnexa: {CHL AMB PHY EX ADNEXA NO MASS DEFAULT:[phone]::"no mass, fullness, tenderness"}               Rectovaginal: Confirms               Anus:  normal sphincter tone, no lesions  *** chaperoned for the exam.  A:  Well Woman with normal exam  P:

## 2021-02-25 ENCOUNTER — Ambulatory Visit: Payer: 59 | Admitting: Obstetrics and Gynecology

## 2021-06-16 ENCOUNTER — Ambulatory Visit: Payer: 59 | Admitting: Obstetrics and Gynecology

## 2021-06-23 ENCOUNTER — Ambulatory Visit: Payer: 59 | Admitting: Obstetrics and Gynecology

## 2021-07-09 NOTE — Progress Notes (Deleted)
40 y.o. G33P2002 Married White or Caucasian Not Hispanic or Latino female here for annual exam.      No LMP recorded.          Sexually active: {yes no:314532}  The current method of family planning is {contraception:315051}.    Exercising: {yes no:314532}  {types:19826} Smoker:  {YES [id number]  Health Maintenance: Pap:  02/25/20 WNL Hr HPV Neg, 03/04/17 normal  History of abnormal Pap:  no MMG:  none  BMD:   none  Colonoscopy: none  TDaP:  12/16/16  Gardasil: none    reports that she has never smoked. She has never used smokeless tobacco. She reports that she does not drink alcohol and does not use drugs.  Past Medical History:  Diagnosis Date   Dysrhythmia    PVC'S   Environmental allergies    Flu    FINISHED TAMIFLU 2/22   Hypothyroidism    Migraine with aura    PVC (premature ventricular contraction)    PVC (premature ventricular contraction)    YEARS AGO    Past Surgical History:  Procedure Laterality Date   CESAREAN SECTION     x2   DILATATION & CURETTAGE/HYSTEROSCOPY WITH MYOSURE N/A 12/12/2017   Procedure: DILATATION & CURETTAGE/HYSTEROSCOPY;  Surgeon: Salvadore Dom, MD;  Location: [facility];  Service: Gynecology;  Laterality: N/A;   DILATION AND CURETTAGE OF UTERUS     HYSTEROSCOPY     WISDOM TOOTH EXTRACTION  2013    Current Outpatient Medications  Medication Sig Dispense Refill   Cholecalciferol (VITAMIN D3) 2000 units TABS Take 2,000 Units by mouth daily.     levocetirizine (XYZAL) 5 MG tablet Take 5 mg by mouth at bedtime.      levothyroxine (SYNTHROID, LEVOTHROID) 75 MCG tablet Take 75 mcg by mouth daily before breakfast.     Lysine 1000 MG TABS Take 1,000 mg by mouth daily.     metoprolol tartrate (LOPRESSOR) 25 MG tablet Take 12.5 mg by mouth 2 (two) times daily.      Multiple Vitamin (MULTIVITAMIN) tablet Take 1 tablet by mouth daily.     topiramate (TOPAMAX) 100 MG tablet Take 100 mg by mouth 2 (two) times daily.      triamcinolone (NASACORT  ALLERGY 24HR) 55 MCG/ACT AERO nasal inhaler Place 1 spray into the nose 2 (two) times daily.     valACYclovir (VALTREX) 1000 MG tablet TAKE 2 TABLETS BY MOUTH EVERY 12 HOURS AS NEEDED COLD SORES.  2   No current facility-administered medications for this visit.    Family History  Problem Relation Age of Onset   Heart attack Mother    Hypertension Mother    Kidney cancer Father        also in other parts of body now   Diabetes Maternal Grandmother        Type 1    Review of Systems  Exam:   There were no vitals taken for this visit.  Weight change: @WEIGHTCHANGE @ Height:      Ht Readings from Last 3 Encounters:  02/25/20 5' 2.25" (1.581 m)  07/28/18 5\' 2"  (1.575 m)  12/09/17 5\' 2"  (1.575 m)    General appearance: alert, cooperative and appears stated age Head: Normocephalic, without obvious abnormality, atraumatic Neck: no adenopathy, supple, symmetrical, trachea midline and thyroid {CHL AMB PHY EX THYROID NORM DEFAULT:[id number]::"normal to inspection and palpation"} Lungs: clear to auscultation bilaterally Cardiovascular: regular rate and rhythm Breasts: {Exam; breast:13139::"normal appearance, no masses or tenderness"} Abdomen: soft, non-tender; non distended,  no masses,  no organomegaly Extremities: extremities normal, atraumatic, no cyanosis or edema Skin: Skin color, texture, turgor normal. No rashes or lesions Lymph nodes: Cervical, supraclavicular, and axillary nodes normal. No abnormal inguinal nodes palpated Neurologic: Grossly normal   Pelvic: External genitalia:  no lesions              Urethra:  normal appearing urethra with no masses, tenderness or lesions              Bartholins and Skenes: normal                 Vagina: normal appearing vagina with normal color and discharge, no lesions              Cervix: {CHL AMB PHY EX CERVIX NORM DEFAULT:[phone]::"no lesions"}               Bimanual Exam:  Uterus:  {CHL AMB PHY EX UTERUS NORM  DEFAULT:[phone]::"normal size, contour, position, consistency, mobility, non-tender"}              Adnexa: {CHL AMB PHY EX ADNEXA NO MASS DEFAULT:[phone]::"no mass, fullness, tenderness"}               Rectovaginal: Confirms               Anus:  normal sphincter tone, no lesions  *** chaperoned for the exam.  A:  Well Woman with normal exam  P:

## 2021-07-14 ENCOUNTER — Ambulatory Visit: Payer: 59 | Admitting: Obstetrics and Gynecology

## 2021-07-17 ENCOUNTER — Emergency Department (HOSPITAL_BASED_OUTPATIENT_CLINIC_OR_DEPARTMENT_OTHER)
Admission: EM | Admit: 2021-07-17 | Discharge: 2021-07-17 | Disposition: A | Discharge status: Home / Self Care | Payer: No Typology Code available for payment source | Attending: Emergency Medicine | Admitting: Emergency Medicine

## 2021-07-17 ENCOUNTER — Encounter (HOSPITAL_BASED_OUTPATIENT_CLINIC_OR_DEPARTMENT_OTHER): Payer: Self-pay | Admitting: Emergency Medicine

## 2021-07-17 ENCOUNTER — Emergency Department (HOSPITAL_BASED_OUTPATIENT_CLINIC_OR_DEPARTMENT_OTHER): Payer: No Typology Code available for payment source | Admitting: Radiology

## 2021-07-17 ENCOUNTER — Other Ambulatory Visit: Payer: Self-pay

## 2021-07-17 DIAGNOSIS — R0602 Shortness of breath: Secondary | ICD-10-CM | POA: Diagnosis present

## 2021-07-17 DIAGNOSIS — E039 Hypothyroidism, unspecified: Secondary | ICD-10-CM | POA: Insufficient documentation

## 2021-07-17 DIAGNOSIS — U071 COVID-19: Secondary | ICD-10-CM | POA: Insufficient documentation

## 2021-07-17 DIAGNOSIS — Z79899 Other long term (current) drug therapy: Secondary | ICD-10-CM | POA: Diagnosis not present

## 2021-07-17 LAB — BASIC METABOLIC PANEL
Anion gap: 9 (ref 5–15)
BUN: 10 mg/dL (ref 6–20)
CO2: 24 mmol/L (ref 22–32)
Calcium: 9.4 mg/dL (ref 8.9–10.3)
Chloride: 108 mmol/L (ref 98–111)
Creatinine, Ser: 0.74 mg/dL (ref 0.44–1.00)
GFR, Estimated: >60 mL/min (ref >60)
Glucose, Bld: 100 mg/dL — ABNORMAL HIGH (ref 70–99)
Potassium: 3.7 mmol/L (ref 3.5–5.1)
Sodium: 141 mmol/L (ref 135–145)

## 2021-07-17 LAB — CBC
HCT: 39.7 % (ref 36.0–46.0)
Hemoglobin: 13.2 g/dL (ref 12.0–15.0)
MCH: 29.7 pg (ref 26.0–34.0)
MCHC: 33.2 g/dL (ref 30.0–36.0)
MCV: 89.4 fL (ref 80.0–100.0)
Platelets: 252 10*3/uL (ref 150–400)
RBC: 4.44 MIL/uL (ref 3.87–5.11)
RDW: 13.6 % (ref 11.5–15.5)
WBC: 3.5 10*3/uL — ABNORMAL LOW (ref 4.0–10.5)
nRBC: 0 % (ref 0.0–0.2)

## 2021-07-17 LAB — PREGNANCY, URINE: Preg Test, Ur: NEGATIVE

## 2021-07-17 LAB — TROPONIN I (HIGH SENSITIVITY): Troponin I (High Sensitivity): <2 ng/L (ref <18)

## 2021-07-17 MED ORDER — ALBUTEROL SULFATE HFA 108 (90 BASE) MCG/ACT IN AERS
2.0000 | INHALATION_SPRAY | Freq: Once | RESPIRATORY_TRACT | Status: AC
  Administered 2021-07-17: 2 via RESPIRATORY_TRACT
  Filled 2021-07-17: qty 6.7

## 2021-07-17 NOTE — ED Provider Notes (Signed)
[facility] DEPT Provider Note   CSN: [id number] Arrival date & time: 07/17/21  1306     History Chief Complaint  Patient presents with   Shortness of Breath    Christie Barron is a 40 y.o. female history of COVID infection about a week ago here presenting with shortness of breath. Patient was diagnosed with COVID on 9/22.  She had some shortness of breath and sore throat at that time and patient had a home test that was positive. Patient then had video visit with her doctor was treated with over-the-counter medicines.  Patient had a fever that resolved within 2 days.  Also has some loose stools that resolved.  Patient noticed that whenever she is more active, she gets short of breath.  Denies any persistent fevers or chest pain.  She called her doctor and was told to come in to the ER for evaluation.   The history is provided by the patient.      Past Medical History:  Diagnosis Date   Dysrhythmia    PVC'S   Environmental allergies    Flu    FINISHED TAMIFLU 2/22   Hypothyroidism    Migraine with aura    PVC (premature ventricular contraction)    PVC (premature ventricular contraction)    YEARS AGO    Patient Active Problem List   Diagnosis Date Noted   Hypothyroidism    Migraine with aura    Unspecified hypothyroidism 05/13/2013    Past Surgical History:  Procedure Laterality Date   CESAREAN SECTION     x2   DILATATION & CURETTAGE/HYSTEROSCOPY WITH MYOSURE N/A 12/12/2017   Procedure: DILATATION & CURETTAGE/HYSTEROSCOPY;  Surgeon: Salvadore Dom, MD;  Location: [facility];  Service: Gynecology;  Laterality: N/A;   DILATION AND CURETTAGE OF UTERUS     HYSTEROSCOPY     WISDOM TOOTH EXTRACTION  2013     OB History     Gravida  2   Para  2   Term  2   Preterm      AB      Living  2      SAB      IAB      Ectopic      Multiple      Live Births  2           Family History  Problem Relation Age of Onset   Heart  attack Mother    Hypertension Mother    Kidney cancer Father        also in other parts of body now   Diabetes Maternal Grandmother        Type 1    Social History   Tobacco Use   Smoking status: Never   Smokeless tobacco: Never  Vaping Use   Vaping Use: Never used  Substance Use Topics   Alcohol use: No   Drug use: No    Home Medications Prior to Admission medications   Medication Sig Start Date End Date Taking? Authorizing Provider  Cholecalciferol (VITAMIN D3) 2000 units TABS Take 2,000 Units by mouth daily.    [provider]  levocetirizine (XYZAL) 5 MG tablet Take 5 mg by mouth at bedtime.  12/31/15   [provider]  levothyroxine (SYNTHROID, LEVOTHROID) 75 MCG tablet Take 75 mcg by mouth daily before breakfast.    [provider]  Lysine 1000 MG TABS Take 1,000 mg by mouth daily.    [provider]  metoprolol  tartrate (LOPRESSOR) 25 MG tablet Take 12.5 mg by mouth 2 (two) times daily.     [provider]  Multiple Vitamin (MULTIVITAMIN) tablet Take 1 tablet by mouth daily.    [provider]  topiramate (TOPAMAX) 100 MG tablet Take 100 mg by mouth 2 (two) times daily.     [provider]  triamcinolone (NASACORT ALLERGY 24HR) 55 MCG/ACT AERO nasal inhaler Place 1 spray into the nose 2 (two) times daily.    [provider]  valACYclovir (VALTREX) 1000 MG tablet TAKE 2 TABLETS BY MOUTH EVERY 12 HOURS AS NEEDED COLD SORES. 12/31/16   [provider]    Allergies    Zithromax [azithromycin]  Review of Systems   Review of Systems  Respiratory:  Positive for shortness of breath.   All other systems reviewed and are negative.  Physical Exam Updated Vital Signs BP 132/79 (BP Location: Right Arm)   Pulse 77   Temp 98.7 F (37.1 C)   Resp 15   LMP 07/15/2021   SpO2 100%   Physical Exam Vitals and nursing note reviewed.  Constitutional:      Appearance: She is well-developed.  HENT:      Head: Normocephalic.     Mouth/Throat:     Mouth: Mucous membranes are moist.  Eyes:     Extraocular Movements: Extraocular movements intact.     Pupils: Pupils are equal, round, and reactive to light.  Cardiovascular:     Rate and Rhythm: Normal rate and regular rhythm.  Pulmonary:     Effort: Pulmonary effort is normal.     Breath sounds: Normal breath sounds.     Comments: No obvious wheezing or crackles Abdominal:     General: Bowel sounds are normal.     Palpations: Abdomen is soft.  Musculoskeletal:        General: Normal range of motion.     Cervical back: Normal range of motion and neck supple.  Skin:    General: Skin is warm.     Capillary Refill: Capillary refill takes less than 2 seconds.  Neurological:     General: No focal deficit present.     Mental Status: She is alert and oriented to person, place, and time.  Psychiatric:        Mood and Affect: Mood normal.        Behavior: Behavior normal.    ED Results / Procedures / Treatments   Labs (all labs ordered are listed, but only abnormal results are displayed) Labs Reviewed  BASIC METABOLIC PANEL - Abnormal; Notable for the following components:      Result Value   Glucose, Bld 100 (*)    All other components within normal limits  CBC - Abnormal; Notable for the following components:   WBC 3.5 (*)    All other components within normal limits  PREGNANCY, URINE  TROPONIN I (HIGH SENSITIVITY)  TROPONIN I (HIGH SENSITIVITY)    EKG EKG Interpretation  Date/Time:  Friday July 17 2021 13:15:29 EDT Ventricular Rate:  80 PR Interval:  140 QRS Duration: 84 QT Interval:  358 QTC Calculation: 412 R Axis:   80 Text Interpretation: Normal sinus rhythm Normal ECG Confirmed by Regan Lemming ([id number]) on 07/17/2021 1:32:42 PM  Radiology No results found.  Procedures Procedures   Medications Ordered in ED Medications  albuterol (VENTOLIN HFA) 108 (90 Base) MCG/ACT inhaler 2 puff (has no administration in  time range)    ED Course  I have reviewed the  triage vital signs and the nursing notes.  Pertinent labs & imaging results that were available during my care of the patient were reviewed by me and considered in my medical decision making (see chart for details).    MDM Rules/Calculators/A&P                           Christie Barron is a 40 y.o. female here presenting with persistent cough.  Patient is about 8days from her initial [place] presentation. Patient is afebrile and well-appearing.  Patient is not hypoxic.  Labs are unremarkable and chest x-ray is clear.  Told her that she can use albuterol as needed.  Told her that this is expected after her COVID infection.   Final Clinical Impression(s) / ED Diagnoses Final diagnoses:  None    Rx / DC Orders ED Discharge Orders     None        Drenda Freeze, MD 07/17/21 [phone]

## 2021-07-17 NOTE — Discharge Instructions (Signed)
You are expected to have some cough and shortness of breath for the next week or so.  You can continue the over-the-counter cough medicine and use albuterol every 4-6 hours as needed for shortness of breath   Your labs and chest x-ray were unremarkable today.  See your doctor for follow-up  Return to ER if you have persistent shortness of breath for a month, oxygen less than 90%, heart rate persistently elevated above 110

## 2021-07-17 NOTE — ED Notes (Signed)
Pt discharged to home. Discharge instructions have been discussed with patient and/or family members. Pt verbally acknowledges understanding d/c instructions, and endorses comprehension to checkout at registration before leaving.  °

## 2021-07-17 NOTE — ED Triage Notes (Signed)
Pt arrives pov with driver, 1 week post covid dx with c/o bradycardia and shob with activity and talking. Pt able to speak in complete sentences in triage. Pt denies CP

## 2021-09-29 ENCOUNTER — Ambulatory Visit: Payer: 59 | Admitting: Obstetrics and Gynecology

## 2021-10-20 ENCOUNTER — Ambulatory Visit: Payer: 59 | Admitting: Obstetrics and Gynecology

## 2021-11-04 NOTE — Progress Notes (Signed)
[id number] y.o. [id number] Married White or Caucasian Not Hispanic or Latino female here for annual exam.  She has facial hair and is tired.  Period Cycle (Days): 28 Period Duration (Days): 7 Period Pattern: Regular Menstrual Flow: Heavy Menstrual Control: Other (Comment) (Flex Cup. She was changing a tampon every hour) Menstrual Control Change Freq (Hours): 12 Dysmenorrhea: (!) Moderate Dysmenorrhea Symptoms: Cramping, Headache  She has a several year h/o a watery vaginal d/c, gets irritated from the wetness. No itching, no odor.   Sexually active, she has long term issues with positional dyspareunia. Tolerable.   Over the last few years she has had an increase in facial hair, needing to pluck daily, having worsening acne.   She c/o fatigue, normal TSH, normal CBC with her primary.  She is sleeping at least 8 hours a night, feels tired when she wakes up. She doesn't snore unless she is congested.  No increase in stress.   H/O hysteroscopy, polypectomy, D&C in 2/19 for AUB, benign pathology. H/O degenerating fibroid not in her cavity (3.8 cm). H/O migraine with aura. She has previously been counseled on mirena iud, ablation and hysterectomy.  Patient's last menstrual period was 10/29/2021.          Sexually active: Yes.    The current method of family planning is vasectomy.    Exercising: No.  The patient does not participate in regular exercise at present. Smoker:  no  Health Maintenance: Pap:  02/25/20 WNL Hr HPV Neg , 03/04/17 normal  History of abnormal Pap:  no MMG:  none  BMD:   none  Colonoscopy: none  TDaP:  01/05/17  Gardasil: no, counseled.    reports that she has never smoked. She has never used smokeless tobacco. She reports that she does not drink alcohol and does not use drugs.  She works for [employer], Loss adjuster, chartered. Son is 70, daughter is 78.     Past Medical History:  Diagnosis Date   Dysrhythmia    PVC'S   Environmental allergies    Flu    FINISHED TAMIFLU  2/22   Hypothyroidism    Migraine with aura    PVC (premature ventricular contraction)    PVC (premature ventricular contraction)    YEARS AGO    Past Surgical History:  Procedure Laterality Date   CESAREAN SECTION     x2   DILATATION & CURETTAGE/HYSTEROSCOPY WITH MYOSURE N/A 12/12/2017   Procedure: DILATATION & CURETTAGE/HYSTEROSCOPY;  Surgeon: Salvadore Dom, MD;  Location: [facility];  Service: Gynecology;  Laterality: N/A;   DILATION AND CURETTAGE OF UTERUS     HYSTEROSCOPY     WISDOM TOOTH EXTRACTION  2013    Current Outpatient Medications  Medication Sig Dispense Refill   Cholecalciferol (VITAMIN D3) 2000 units TABS Take 2,000 Units by mouth daily.     levocetirizine (XYZAL) 5 MG tablet Take 5 mg by mouth at bedtime.      levothyroxine (SYNTHROID, LEVOTHROID) 75 MCG tablet Take 75 mcg by mouth daily before breakfast.     Lysine 1000 MG TABS Take 1,000 mg by mouth daily.     metoprolol tartrate (LOPRESSOR) 25 MG tablet Take 12.5 mg by mouth 2 (two) times daily.      Multiple Vitamin (MULTIVITAMIN) tablet Take 1 tablet by mouth daily.     topiramate (TOPAMAX) 100 MG tablet Take 100 mg by mouth 2 (two) times daily.      triamcinolone (NASACORT) 55 MCG/ACT AERO nasal inhaler Place 1 spray into the nose  2 (two) times daily.     valACYclovir (VALTREX) 1000 MG tablet TAKE 2 TABLETS BY MOUTH EVERY 12 HOURS AS NEEDED COLD SORES.  2   No current facility-administered medications for this visit.    Family History  Problem Relation Age of Onset   Heart attack Mother    Hypertension Mother    Kidney cancer Father        also in other parts of body now   Diabetes Maternal Grandmother        Type 1    Review of Systems  Constitutional:  Positive for fatigue.  Genitourinary:  Positive for vaginal discharge.   Exam:   BP 118/64    Pulse 72    Ht 5\' 2"  (1.575 m)    Wt 173 lb (78.5 kg)    LMP 10/29/2021    SpO2 99%    BMI 31.64 kg/m   Weight change: @WEIGHTCHANGE @ Height:    Height: 5\' 2"  (157.5 cm)  Ht Readings from Last 3 Encounters:  11/06/21 5\' 2"  (1.575 m)  02/25/20 5' 2.25" (1.581 m)  07/28/18 5\' 2"  (1.575 m)    General appearance: alert, cooperative and appears stated age Head: Normocephalic, without obvious abnormality, atraumatic Neck: no adenopathy, supple, symmetrical, trachea midline and thyroid normal to inspection and palpation Lungs: clear to auscultation bilaterally Cardiovascular: regular rate and rhythm Breasts: normal appearance, no masses or tenderness Abdomen: soft, non-tender; non distended,  no masses,  no organomegaly Extremities: extremities normal, atraumatic, no cyanosis or edema Skin: Skin color, texture, turgor normal. No rashes or lesions. No hirsutism noted but she removed the hair this am. Lymph nodes: Cervical, supraclavicular, and axillary nodes normal. No abnormal inguinal nodes palpated Neurologic: Grossly normal   Pelvic: External genitalia:  no lesions              Urethra:  normal appearing urethra with no masses, tenderness or lesions              Bartholins and Skenes: normal                 Vagina: normal appearing vagina with normal color and discharge, no lesions              Cervix: nabothian cyst and no lesions               Bimanual Exam:  Uterus:  normal size, contour, position, consistency, mobility, non-tender and anteverted              Adnexa: no mass, fullness, tenderness               Rectovaginal: Confirms               Anus:  normal sphincter tone, no lesions  Santiago Glad, CMA chaperoned for the exam.  1. Well woman exam Discussed breast self exam Discussed calcium and vit D intake No pap this year Mammogram # given, she will schedule Labs with primary (UTD)  2. Vaginal discharge Normal exam - WET PREP FOR TRICH, YEAST, CLUE -Discussed vulvar skin care - SureSwab Advanced Vaginitis, TMA  3. Hirsutism - Testosterone, Total, LC/MS/MS - 17-Hydroxyprogesterone  4. Other fatigue Normal CBC  and TSH with her primary We discussed exercise, eating healthy Discussed evaluation for sleep apnea (BMI 31)

## 2021-11-06 ENCOUNTER — Other Ambulatory Visit: Payer: Self-pay

## 2021-11-06 ENCOUNTER — Encounter: Payer: Self-pay | Admitting: Obstetrics and Gynecology

## 2021-11-06 ENCOUNTER — Ambulatory Visit (INDEPENDENT_AMBULATORY_CARE_PROVIDER_SITE_OTHER): Payer: No Typology Code available for payment source | Admitting: Obstetrics and Gynecology

## 2021-11-06 VITALS — BP 118/64 | HR 72 | Ht 62.0 in | Wt 173.0 lb

## 2021-11-06 DIAGNOSIS — L680 Hirsutism: Secondary | ICD-10-CM | POA: Diagnosis not present

## 2021-11-06 DIAGNOSIS — I493 Ventricular premature depolarization: Secondary | ICD-10-CM | POA: Insufficient documentation

## 2021-11-06 DIAGNOSIS — E663 Overweight: Secondary | ICD-10-CM | POA: Insufficient documentation

## 2021-11-06 DIAGNOSIS — Z01419 Encounter for gynecological examination (general) (routine) without abnormal findings: Secondary | ICD-10-CM

## 2021-11-06 DIAGNOSIS — R5383 Other fatigue: Secondary | ICD-10-CM | POA: Diagnosis not present

## 2021-11-06 DIAGNOSIS — N898 Other specified noninflammatory disorders of vagina: Secondary | ICD-10-CM

## 2021-11-06 DIAGNOSIS — E559 Vitamin D deficiency, unspecified: Secondary | ICD-10-CM | POA: Insufficient documentation

## 2021-11-06 DIAGNOSIS — J309 Allergic rhinitis, unspecified: Secondary | ICD-10-CM | POA: Insufficient documentation

## 2021-11-06 DIAGNOSIS — K148 Other diseases of tongue: Secondary | ICD-10-CM | POA: Insufficient documentation

## 2021-11-06 LAB — WET PREP FOR TRICH, YEAST, CLUE

## 2021-11-06 NOTE — Patient Instructions (Signed)

## 2021-11-09 LAB — SURESWAB® ADVANCED VAGINITIS,TMA
CANDIDA SPECIES: NOT DETECTED
Candida glabrata: NOT DETECTED
SURESWAB(R) ADV BACTERIAL VAGINOSIS(BV),TMA: NEGATIVE
TRICHOMONAS VAGINALIS (TV),TMA: NOT DETECTED

## 2021-11-12 LAB — 17-HYDROXYPROGESTERONE: 17-OH-Progesterone, LC/MS/MS: 70 ng/dL

## 2021-11-12 LAB — TESTOSTERONE, TOTAL, LC/MS/MS: Testosterone, Total, LC-MS-MS: 24 ng/dL (ref 2–45)

## 2021-12-09 ENCOUNTER — Other Ambulatory Visit: Payer: Self-pay

## 2021-12-09 ENCOUNTER — Encounter: Payer: Self-pay | Admitting: Obstetrics and Gynecology

## 2021-12-09 ENCOUNTER — Telehealth: Payer: Self-pay | Admitting: Obstetrics and Gynecology

## 2021-12-09 ENCOUNTER — Telehealth: Payer: Self-pay

## 2021-12-09 ENCOUNTER — Ambulatory Visit (INDEPENDENT_AMBULATORY_CARE_PROVIDER_SITE_OTHER): Payer: No Typology Code available for payment source | Admitting: Obstetrics and Gynecology

## 2021-12-09 VITALS — BP 122/68 | HR 67 | Temp 99.2°F | Ht 62.0 in | Wt 174.0 lb

## 2021-12-09 DIAGNOSIS — N3090 Cystitis, unspecified without hematuria: Secondary | ICD-10-CM | POA: Diagnosis not present

## 2021-12-09 DIAGNOSIS — M545 Low back pain, unspecified: Secondary | ICD-10-CM | POA: Diagnosis not present

## 2021-12-09 MED ORDER — SULFAMETHOXAZOLE-TRIMETHOPRIM 800-160 MG PO TABS: 1.0000 | ORAL_TABLET | Freq: Two times a day (BID) | ORAL | 0 refills | Status: DC

## 2021-12-09 MED ORDER — PHENAZOPYRIDINE HCL 200 MG PO TABS: 200.0000 mg | ORAL_TABLET | Freq: Three times a day (TID) | ORAL | 0 refills | Status: DC

## 2021-12-09 NOTE — Patient Instructions (Signed)
Acute Back Pain, Adult Acute back pain is sudden and usually short-lived. It is often caused by an injury to the muscles and tissues in the back. The injury may result from: A muscle, tendon, or ligament getting overstretched or torn. Ligaments are tissues that connect bones to each other. Lifting something improperly can cause a back strain. Wear and tear (degeneration) of the spinal disks. Spinal disks are circular tissue that provide cushioning between the bones of the spine (vertebrae). Twisting motions, such as while playing sports or doing yard work. A hit to the back. Arthritis. You may have a physical exam, lab tests, and imaging tests to find the cause of your pain. Acute back pain usually goes away with rest and home care. Follow these instructions at home: Managing pain, stiffness, and swelling Take over-the-counter and prescription medicines only as told by your health care provider. Treatment may include medicines for pain and inflammation that are taken by mouth or applied to the skin, or muscle relaxants. Your health care provider may recommend applying ice during the first 24-48 hours after your pain starts. To do this: Put ice in a plastic bag. Place a towel between your skin and the bag. Leave the ice on for 20 minutes, 2-3 times a day. Remove the ice if your skin turns bright red. This is very important. If you cannot feel pain, heat, or cold, you have a greater risk of damage to the area. If directed, apply heat to the affected area as often as told by your health care provider. Use the heat source that your health care provider recommends, such as a moist heat pack or a heating pad. Place a towel between your skin and the heat source. Leave the heat on for 20-30 minutes. Remove the heat if your skin turns bright red. This is especially important if you are unable to feel pain, heat, or cold. You have a greater risk of getting burned. Activity  Do not stay in bed. Staying in  bed for more than 1-2 days can delay your recovery. Sit up and stand up straight. Avoid leaning forward when you sit or hunching over when you stand. If you work at a desk, sit close to it so you do not need to lean over. Keep your chin tucked in. Keep your neck drawn back, and keep your elbows bent at a 90-degree angle (right angle). Sit high and close to the steering wheel when you drive. Add lower back (lumbar) support to your car seat, if needed. Take short walks on even surfaces as soon as you are able. Try to increase the length of time you walk each day. Do not sit, drive, or stand in one place for more than 30 minutes at a time. Sitting or standing for long periods of time can put stress on your back. Do not drive or use heavy machinery while taking prescription pain medicine. Use proper lifting techniques. When you bend and lift, use positions that put less stress on your back: [place] your knees. Keep the load close to your body. Avoid twisting. Exercise regularly as told by your health care provider. Exercising helps your back heal faster and helps prevent back injuries by keeping muscles strong and flexible. Work with a physical therapist to make a safe exercise program, as recommended by your health care provider. Do any exercises as told by your physical therapist. Lifestyle Maintain a healthy weight. Extra weight puts stress on your back and makes it difficult to have good  posture. Avoid activities or situations that make you feel anxious or stressed. Stress and anxiety increase muscle tension and can make back pain worse. Learn ways to manage anxiety and stress, such as through exercise. General instructions Sleep on a firm mattress in a comfortable position. Try lying on your side with your knees slightly bent. If you lie on your back, put a pillow under your knees. Keep your head and neck in a straight line with your spine (neutral position) when using electronic equipment like  smartphones or pads. To do this: Raise your smartphone or pad to look at it instead of bending your head or neck to look down. Put the smartphone or pad at the level of your face while looking at the screen. Follow your treatment plan as told by your health care provider. This may include: Cognitive or behavioral therapy. Acupuncture or massage therapy. Meditation or yoga. Contact a health care provider if: You have pain that is not relieved with rest or medicine. You have increasing pain going down into your legs or buttocks. Your pain does not improve after 2 weeks. You have pain at night. You lose weight without trying. You have a fever or chills. You develop nausea or vomiting. You develop abdominal pain. Get help right away if: You develop new bowel or bladder control problems. You have unusual weakness or numbness in your arms or legs. You feel faint. These symptoms may represent a serious problem that is an emergency. Do not wait to see if the symptoms will go away. Get medical help right away. Call your local emergency services (911 in the [place] Do not drive yourself to the hospital. Summary Acute back pain is sudden and usually short-lived. Use proper lifting techniques. When you bend and lift, use positions that put less stress on your back. Take over-the-counter and prescription medicines only as told by your health care provider, and apply heat or ice as told. This information is not intended to replace advice given to you by your health care provider. Make sure you discuss any questions you have with your health care provider. Document Revised: 12/26/2020 Document Reviewed: 12/26/2020 Elsevier Patient Education  [place]. Urinary Tract Infection, Adult A urinary tract infection (UTI) is an infection of any part of the urinary tract. The urinary tract includes the kidneys, ureters, bladder, and urethra. These organs make, store, and get rid of urine in the  body. An upper UTI affects the ureters and kidneys. A lower UTI affects the bladder and urethra. What are the causes? Most urinary tract infections are caused by bacteria in your genital area around your urethra, where urine leaves your body. These bacteria grow and cause inflammation of your urinary tract. What increases the risk? You are more likely to develop this condition if: You have a urinary catheter that stays in place. You are not able to control when you urinate or have a bowel movement (incontinence). You are female and you: Use a spermicide or diaphragm for birth control. Have low estrogen levels. Are pregnant. You have certain genes that increase your risk. You are sexually active. You take antibiotic medicines. You have a condition that causes your flow of urine to slow down, such as: An enlarged prostate, if you are female. Blockage in your urethra. A kidney stone. A nerve condition that affects your bladder control (neurogenic bladder). Not getting enough to drink, or not urinating often. You have certain medical conditions, such as: Diabetes. A weak disease-fighting system (immunesystem). Sickle  cell disease. Gout. Spinal cord injury. What are the signs or symptoms? Symptoms of this condition include: Needing to urinate right away (urgency). Frequent urination. This may include small amounts of urine each time you urinate. Pain or burning with urination. Blood in the urine. Urine that smells bad or unusual. Trouble urinating. Cloudy urine. Vaginal discharge, if you are female. Pain in the abdomen or the lower back. You may also have: Vomiting or a decreased appetite. Confusion. Irritability or tiredness. A fever or chills. Diarrhea. The first symptom in older adults may be confusion. In some cases, they may not have any symptoms until the infection has worsened. How is this diagnosed? This condition is diagnosed based on your medical history and a  physical exam. You may also have other tests, including: Urine tests. Blood tests. Tests for STIs (sexually transmitted infections). If you have had more than one UTI, a cystoscopy or imaging studies may be done to determine the cause of the infections. How is this treated? Treatment for this condition includes: Antibiotic medicine. Over-the-counter medicines to treat discomfort. Drinking enough water to stay hydrated. If you have frequent infections or have other conditions such as a kidney stone, you may need to see a health care provider who specializes in the urinary tract (urologist). In rare cases, urinary tract infections can cause sepsis. Sepsis is a life-threatening condition that occurs when the body responds to an infection. Sepsis is treated in the hospital with IV antibiotics, fluids, and other medicines. Follow these instructions at home: Medicines Take over-the-counter and prescription medicines only as told by your health care provider. If you were prescribed an antibiotic medicine, take it as told by your health care provider. Do not stop using the antibiotic even if you start to feel better. General instructions Make sure you: Empty your bladder often and completely. Do not hold urine for long periods of time. Empty your bladder after sex. Wipe from front to back after urinating or having a bowel movement if you are female. Use each tissue only one time when you wipe. Drink enough fluid to keep your urine pale yellow. Keep all follow-up visits. This is important. Contact a health care provider if: Your symptoms do not get better after 1-2 days. Your symptoms go away and then return. Get help right away if: You have severe pain in your back or your lower abdomen. You have a fever or chills. You have nausea or vomiting. Summary A urinary tract infection (UTI) is an infection of any part of the urinary tract, which includes the kidneys, ureters, bladder, and  urethra. Most urinary tract infections are caused by bacteria in your genital area. Treatment for this condition often includes antibiotic medicines. If you were prescribed an antibiotic medicine, take it as told by your health care provider. Do not stop using the antibiotic even if you start to feel better. Keep all follow-up visits. This is important. This information is not intended to replace advice given to you by your health care provider. Make sure you discuss any questions you have with your health care provider. Document Revised: 05/16/2020 Document Reviewed: 05/16/2020 Elsevier Patient Education  [place].

## 2021-12-09 NOTE — Telephone Encounter (Signed)
Dr. Talbert Nan asked me to check with pharmacy to see if okay to crush Bactrim DS and Pyridium as the patient has difficulty swallowing pills.   Pharmacist, Kim at patient's pharmacy said fine to crush Bactrim DS  and Pyridium and fine to open Macrobid caps to take.  Also, the Bactrim DS is available in suspension and she would need to take 20 mls bid x 3 days or ever how many days Dr. Talbert Nan ordered.  At Dr. Gentry Fitz request I relayed this info to her through private chat message.

## 2021-12-09 NOTE — Progress Notes (Signed)
GYNECOLOGY  VISIT   HPI: 41 y.o.   Married White or Caucasian Not Hispanic or Latino  female   [phone] with No LMP recorded.   here for  lower back pain urgency, dysuria and hematuria.  The lower back started ~1.5 weeks ago, constant ache.  The pain with urination started last night, gross hematuria. She has urinary urgency and frequency.  Not on her cycle. Some lower abdominal pains, intermittently.   GYNECOLOGIC HISTORY: No LMP recorded. Contraception:none  Menopausal hormone therapy: none         OB History     Gravida  2   Para  2   Term  2   Preterm      AB      Living  2      SAB      IAB      Ectopic      Multiple      Live Births  2              Patient Active Problem List   Diagnosis Date Noted   Allergic rhinitis 11/06/2021   Lesion of tongue 11/06/2021   Overweight 11/06/2021   Ventricular premature depolarization 11/06/2021   Vitamin D deficiency 11/06/2021   Hypothyroidism    Migraine with aura    Unspecified hypothyroidism 05/13/2013    Past Medical History:  Diagnosis Date   Dysrhythmia    PVC'S   Environmental allergies    Flu    FINISHED TAMIFLU 2/22   Hypothyroidism    Migraine with aura    PVC (premature ventricular contraction)    PVC (premature ventricular contraction)    YEARS AGO    Past Surgical History:  Procedure Laterality Date   CESAREAN SECTION     x2   DILATATION & CURETTAGE/HYSTEROSCOPY WITH MYOSURE N/A 12/12/2017   Procedure: DILATATION & CURETTAGE/HYSTEROSCOPY;  Surgeon: Salvadore Dom, MD;  Location: [facility];  Service: Gynecology;  Laterality: N/A;   DILATION AND CURETTAGE OF UTERUS     HYSTEROSCOPY     WISDOM TOOTH EXTRACTION  2013    Current Outpatient Medications  Medication Sig Dispense Refill   Cholecalciferol (VITAMIN D3) 2000 units TABS Take 2,000 Units by mouth daily.     levocetirizine (XYZAL) 5 MG tablet Take 5 mg by mouth at bedtime.      levothyroxine (SYNTHROID, LEVOTHROID) 75 MCG  tablet Take 75 mcg by mouth daily before breakfast.     Lysine 1000 MG TABS Take 1,000 mg by mouth daily.     metoprolol tartrate (LOPRESSOR) 25 MG tablet Take 12.5 mg by mouth 2 (two) times daily.      Multiple Vitamin (MULTIVITAMIN) tablet Take 1 tablet by mouth daily.     topiramate (TOPAMAX) 100 MG tablet Take 100 mg by mouth 2 (two) times daily.      triamcinolone (NASACORT) 55 MCG/ACT AERO nasal inhaler Place 1 spray into the nose 2 (two) times daily.     valACYclovir (VALTREX) 1000 MG tablet TAKE 2 TABLETS BY MOUTH EVERY 12 HOURS AS NEEDED COLD SORES.  2   No current facility-administered medications for this visit.     ALLERGIES: Zithromax [azithromycin]  Family History  Problem Relation Age of Onset   Heart attack Mother    Hypertension Mother    Kidney cancer Father        also in other parts of body now   Diabetes Maternal Grandmother        Type 1  Social History   Socioeconomic History   Marital status: Married    Spouse name: Not on file   Number of children: Not on file   Years of education: Not on file   Highest education level: Not on file  Occupational History   Not on file  Tobacco Use   Smoking status: Never   Smokeless tobacco: Never  Vaping Use   Vaping Use: Never used  Substance and Sexual Activity   Alcohol use: No   Drug use: No   Sexual activity: Yes    Partners: Male    Birth control/protection: None    Comment: Husband has Vasectomy   Other Topics Concern   Not on file  Social History Narrative   Not on file   Social Determinants of Health   Financial Resource Strain: Not on file  Food Insecurity: Not on file  Transportation Needs: Not on file  Physical Activity: Not on file  Stress: Not on file  Social Connections: Not on file  Intimate Partner Violence: Not on file    Review of Systems  Genitourinary:  Positive for dysuria, flank pain, frequency, hematuria and urgency.   PHYSICAL EXAMINATION:    BP 122/68    Pulse 67     Ht 5\' 2"  (1.575 m)    Wt 174 lb (78.9 kg)    SpO2 99%    BMI 31.83 kg/m , T 99.2    General appearance: alert, cooperative and appears stated age CVA: not tender Abdomen: soft, non-tender; non distended, no masses,  no organomegaly  1. Cystitis - Urinalysis,Complete w/RFL Culture - Urine Culture - REFLEXIVE URINE CULTURE - sulfamethoxazole-trimethoprim (BACTRIM DS) 800-160 MG tablet; Take 1 tablet by mouth 2 (two) times daily. One PO BID x 3 days  Dispense: 6 tablet; Refill: 0 - phenazopyridine (PYRIDIUM) 200 MG tablet; Take 1 tablet (200 mg total) by mouth 3 (three) times daily with meals.  Dispense: 6 tablet; Refill: 0  2. Acute bilateral low back pain without sciatica Take ibuprofen Try heat or ice Information in mychart.

## 2021-12-09 NOTE — Telephone Encounter (Signed)
Mychart message sent.

## 2021-12-11 ENCOUNTER — Encounter: Payer: Self-pay | Admitting: Obstetrics and Gynecology

## 2021-12-11 LAB — URINALYSIS, COMPLETE W/RFL CULTURE
Bilirubin Urine: NEGATIVE
Casts: NONE SEEN /LPF
Crystals: NONE SEEN /HPF
Glucose, UA: NEGATIVE
Hyaline Cast: NONE SEEN /LPF
Ketones, ur: NEGATIVE
Nitrites, Initial: NEGATIVE
Protein, ur: NEGATIVE
Specific Gravity, Urine: 1.01 (ref 1.001–1.035)
Yeast: NONE SEEN /HPF
pH: 7 (ref 5.0–8.0)

## 2021-12-11 LAB — URINE CULTURE
MICRO NUMBER:: 13042494
SPECIMEN QUALITY:: ADEQUATE

## 2021-12-11 LAB — CULTURE INDICATED

## 2021-12-14 ENCOUNTER — Telehealth: Payer: Self-pay

## 2021-12-14 NOTE — Telephone Encounter (Signed)
Per Dr. Talbert Nan result note "Please place a referral to Urology for hematuria and pain."

## 2021-12-14 NOTE — Telephone Encounter (Signed)
Referral request faxed to [facility] on 12/11/21 per Beckie Salts. CMA.

## 2021-12-16 NOTE — Telephone Encounter (Signed)
Per [facility] message left 12/15/21 for patient to call and schedule. ?

## 2022-01-15 NOTE — Telephone Encounter (Signed)
01/15/22 urology sent faxed that messages have been left on 12/15/26, 01/13/22 to call and schedule. ?

## 2022-02-11 NOTE — Telephone Encounter (Signed)
[facility] has left several message for patient to call and no return phone call from patient. Please advise  ?

## 2022-02-18 NOTE — Telephone Encounter (Signed)
I called patient and per DPR access note on file I left message asking patient if she still would like referral to [facility]. They have left numerous messages and she has not returned their call.  I told her I can cancel the referral if she does not plan to schedule with them. ?

## 2022-04-11 ENCOUNTER — Other Ambulatory Visit: Payer: Self-pay | Admitting: Obstetrics and Gynecology

## 2022-08-20 ENCOUNTER — Ambulatory Visit: Payer: No Typology Code available for payment source | Admitting: Internal Medicine

## 2022-08-20 ENCOUNTER — Encounter: Payer: Self-pay | Admitting: Internal Medicine

## 2022-08-20 VITALS — BP 127/74 | HR 69 | Ht 62.5 in | Wt 179.6 lb

## 2022-08-20 DIAGNOSIS — Z8249 Family history of ischemic heart disease and other diseases of the circulatory system: Secondary | ICD-10-CM

## 2022-08-20 DIAGNOSIS — R002 Palpitations: Secondary | ICD-10-CM

## 2022-08-20 DIAGNOSIS — I493 Ventricular premature depolarization: Secondary | ICD-10-CM

## 2022-08-20 MED ORDER — DILTIAZEM HCL 30 MG PO TABS: 30.0000 mg | ORAL_TABLET | Freq: Three times a day (TID) | ORAL | 6 refills | Status: DC | PRN

## 2022-08-20 NOTE — Progress Notes (Signed)
Primary Physician/Referring:  Kathyrn Lass, MD  Patient ID: Christie Barron, female    DOB: [birth year], 41 y.o.   MRN: [id number]  Chief Complaint  Patient presents with   New Patient (Initial Visit)   family hx of cad   HPI:    Christie Barron  is a 41 y.o. female with past medical history significant for hypothyroidism, palpitations, and frequent PVCs who is here to establish care with cardiology.  Patient has a significant family history of early cardiac disease.  Everyone on her mother side has had heart attacks, carotid disease, and peripheral arterial disease.  All of these family members were first diagnosed around age 20.  Her mother had her first heart attack at age 51.  Patient does not smoke or drink alcohol.  She denies chest pain, shortness of breath, diaphoresis, syncope, orthopnea, edema, claudication, PND.  She is worried given her significant family history that she may also have coronary disease.  She does have palpitations which she wore an event monitor for about 10 years ago and saw cardiologist for this.  Her PVCs were benign and not very frequent but now they are happening more often.  Patient is agreeable to adding as needed Cardizem for persistent palpitations.  She would like to hold off on obtaining event monitor until next visit if the medication does not work.  Past Medical History:  Diagnosis Date   Dysrhythmia    PVC'S   Environmental allergies    Flu    FINISHED TAMIFLU 2/22   Hypothyroidism    Migraine with aura    PVC (premature ventricular contraction)    PVC (premature ventricular contraction)    YEARS AGO   Past Surgical History:  Procedure Laterality Date   CESAREAN SECTION     x2   DILATATION & CURETTAGE/HYSTEROSCOPY WITH MYOSURE N/A 12/12/2017   Procedure: DILATATION & CURETTAGE/HYSTEROSCOPY;  Surgeon: Salvadore Dom, MD;  Location: [facility];  Service: Gynecology;  Laterality: N/A;   DILATION AND CURETTAGE OF UTERUS     HYSTEROSCOPY      WISDOM TOOTH EXTRACTION  2013   Family History  Problem Relation Age of Onset   Heart attack Mother    Hypertension Mother    Kidney cancer Father        also in other parts of body now   Diabetes Maternal Grandmother        Type 1    Social History   Tobacco Use   Smoking status: Never   Smokeless tobacco: Never  Substance Use Topics   Alcohol use: No   Marital Status: Married  ROS  Review of Systems  Cardiovascular:  Positive for irregular heartbeat and palpitations.   Objective  Blood pressure 127/74, pulse 69, height 5' 2.5" (1.588 m), weight 179 lb 9.6 oz (81.5 kg), SpO2 98 %. Body mass index is 32.33 kg/m.     08/20/2022    8:47 AM 12/09/2021    2:26 PM 11/06/2021    8:39 AM  Vitals with BMI  Height 5' 2.5" '5\' 2"'$  '5\' 2"'$   Weight 179 lbs 10 oz 174 lbs 173 lbs  BMI 32.31 50.35 46.56  Systolic 812 751 700  Diastolic 74 68 64  Pulse 69 67 72     Physical Exam Vitals reviewed.  HENT:     Head: Normocephalic and atraumatic.  Cardiovascular:     Rate and Rhythm: Normal rate and regular rhythm.     Pulses: Normal pulses.  Heart sounds: Normal heart sounds. No murmur heard. Pulmonary:     Effort: Pulmonary effort is normal.     Breath sounds: Normal breath sounds.  Abdominal:     General: Bowel sounds are normal.  Musculoskeletal:     Right lower leg: No edema.     Left lower leg: No edema.  Skin:    General: Skin is warm and dry.  Neurological:     Mental Status: She is alert.     Medications and allergies   Allergies  Allergen Reactions   Zithromax [Azithromycin] Other (See Comments)     Medication list after today's encounter   Current Outpatient Medications:    Azelastine HCl 137 MCG/SPRAY SOLN, SMARTSIG:1-2 Puff(s) Both Nares Twice Daily, Disp: , Rfl:    Cholecalciferol (VITAMIN D3) 2000 units TABS, Take 2,000 Units by mouth daily., Disp: , Rfl:    levocetirizine (XYZAL) 5 MG tablet, Take 5 mg by mouth at bedtime. , Disp: , Rfl:     levothyroxine (SYNTHROID, LEVOTHROID) 75 MCG tablet, Take 75 mcg by mouth daily before breakfast., Disp: , Rfl:    Lysine 1000 MG TABS, Take 1,000 mg by mouth daily., Disp: , Rfl:    metoprolol tartrate (LOPRESSOR) 25 MG tablet, Take 12.5 mg by mouth 2 (two) times daily. , Disp: , Rfl:    montelukast (SINGULAIR) 10 MG tablet, Take 10 mg by mouth at bedtime., Disp: , Rfl:    Multiple Vitamin (MULTIVITAMIN) tablet, Take 1 tablet by mouth daily., Disp: , Rfl:    topiramate (TOPAMAX) 100 MG tablet, Take 100 mg by mouth 2 (two) times daily. , Disp: , Rfl:    valACYclovir (VALTREX) 1000 MG tablet, TAKE 2 TABLETS BY MOUTH EVERY 12 HOURS AS NEEDED COLD SORES., Disp: , Rfl: 2   phenazopyridine (PYRIDIUM) 200 MG tablet, Take 1 tablet (200 mg total) by mouth 3 (three) times daily with meals. (Patient not taking: Reported on 08/20/2022), Disp: 6 tablet, Rfl: 0   sulfamethoxazole-trimethoprim (BACTRIM DS) 800-160 MG tablet, Take 1 tablet by mouth 2 (two) times daily. One PO BID x 3 days (Patient not taking: Reported on 08/20/2022), Disp: 6 tablet, Rfl: 0   triamcinolone (NASACORT) 55 MCG/ACT AERO nasal inhaler, Place 1 spray into the nose 2 (two) times daily., Disp: , Rfl:   Laboratory examination:   Lab Results  Component Value Date   NA 141 07/17/2021   K 3.7 07/17/2021   CO2 24 07/17/2021   GLUCOSE 100 (H) 07/17/2021   BUN 10 07/17/2021   CREATININE 0.74 07/17/2021   CALCIUM 9.4 07/17/2021   GFRNONAA >60 07/17/2021       Latest Ref Rng & Units 07/17/2021    1:46 PM 07/28/2018    8:51 PM 12/09/2017    3:50 PM  CMP  Glucose 70 - 99 mg/dL 100  95  79   BUN 6 - 20 mg/dL '10  14  10   '$ Creatinine 0.44 - 1.00 mg/dL 0.74  0.84  0.71   Sodium 135 - 145 mmol/L 141  139  135   Potassium 3.5 - 5.1 mmol/L 3.7  3.4  3.6   Chloride 98 - 111 mmol/L 108  107  105   CO2 22 - 32 mmol/L '24  24  21   '$ Calcium 8.9 - 10.3 mg/dL 9.4  8.8  9.2       Latest Ref Rng & Units 07/17/2021    1:46 PM 02/25/2020    4:14 PM  07/28/2018  8:51 PM  CBC  WBC 4.0 - 10.5 K/uL 3.5  6.3  5.7   Hemoglobin 12.0 - 15.0 g/dL 13.2  13.2  13.3   Hematocrit 36.0 - 46.0 % 39.7  39.0  40.1   Platelets 150 - 400 K/uL 252  250  243     Lipid Panel No results for input(s): "CHOL", "TRIG", "Numa", "VLDL", "HDL", "CHOLHDL", "LDLDIRECT" in the last 8760 hours.  HEMOGLOBIN A1C No results found for: "HGBA1C", "MPG" TSH No results for input(s): "TSH" in the last 8760 hours.  External labs:     Radiology:    Cardiac Studies:   No results found for this or any previous visit from the past 1095 days.     No results found for this or any previous visit from the past 1095 days.     EKG:   08/20/2022: NSR, normal axis, normal R wave progression, no evidence of ischemia  Assessment     ICD-10-CM   1. Family history of early CAD  Z82.49 EKG 12-Lead    CT CORONARY MORPH W/CTA COR W/SCORE W/CA W/CM &/OR WO/CM    2. Symptomatic PVCs  I49.3     3. Palpitations  R00.2        Orders Placed This Encounter  Procedures   CT CORONARY MORPH W/CTA COR W/SCORE W/CA W/CM &/OR WO/CM    Standing Status:   Future    Standing Expiration Date:   11/20/2022    Order Specific Question:   If indicated for the ordered procedure, I authorize the administration of contrast media per Radiology protocol    Answer:   Yes    Order Specific Question:   Is patient pregnant?    Answer:   No    Order Specific Question:   Preferred Imaging Location?    Answer:   [facility]   EKG 12-Lead    No orders of the defined types were placed in this encounter.   There are no discontinued medications.   Recommendations:   Christie Barron is a 41 y.o.  with family history of early heart disease   Family history of early CAD Coronary CT with CACS ordered for risk stratification   Symptomatic PVCs Will discuss event monitor at next visit if occurring more frequently    Palpitations Continue on metoprolol Will add cardizem  PRN for severe palpitations Event monitor about 10 years ago showed benign PVCs  Follow-up in 6 weeks or sooner if needed     Floydene Flock, DO, [facility]  08/20/2022, 9:17 AM Office: [phone] Pager: [phone]

## 2022-09-30 ENCOUNTER — Encounter: Payer: Self-pay | Admitting: Internal Medicine

## 2022-09-30 DIAGNOSIS — Z8249 Family history of ischemic heart disease and other diseases of the circulatory system: Secondary | ICD-10-CM

## 2022-10-01 ENCOUNTER — Ambulatory Visit: Payer: No Typology Code available for payment source | Admitting: Internal Medicine

## 2022-10-07 ENCOUNTER — Ambulatory Visit: Payer: No Typology Code available for payment source | Admitting: Internal Medicine

## 2022-10-19 ENCOUNTER — Other Ambulatory Visit: Payer: Self-pay | Admitting: Obstetrics and Gynecology

## 2022-10-19 DIAGNOSIS — Z1231 Encounter for screening mammogram for malignant neoplasm of breast: Secondary | ICD-10-CM

## 2022-10-20 ENCOUNTER — Telehealth: Payer: Self-pay

## 2022-10-20 NOTE — Telephone Encounter (Signed)
Patient called to say her cardiac ct is $2000, the test that was ordered CT CORONARY MORPH W/CTA COR W/SCORE , is there something cheaper?

## 2022-10-21 ENCOUNTER — Other Ambulatory Visit: Payer: Self-pay

## 2022-10-21 DIAGNOSIS — Z8249 Family history of ischemic heart disease and other diseases of the circulatory system: Secondary | ICD-10-CM

## 2022-10-21 LAB — BASIC METABOLIC PANEL
BUN/Creatinine Ratio: 13 (ref 9–23)
BUN: 11 mg/dL (ref 6–24)
CO2: 23 mmol/L (ref 20–29)
Calcium: 8.8 mg/dL (ref 8.7–10.2)
Chloride: 105 mmol/L (ref 96–106)
Creatinine, Ser: 0.83 mg/dL (ref 0.57–1.00)
Glucose: 125 mg/dL — ABNORMAL HIGH (ref 70–99)
Potassium: 3.6 mmol/L (ref 3.5–5.2)
Sodium: 141 mmol/L (ref 134–144)
eGFR: 91 mL/min/{1.73_m2} (ref >59)

## 2022-10-21 NOTE — Telephone Encounter (Signed)
Spoke with patient and let her know that test was changed

## 2022-10-21 NOTE — Telephone Encounter (Signed)
Yes change to just calcium score

## 2022-10-21 NOTE — Telephone Encounter (Signed)
Done lvm for patient to call back

## 2022-10-22 ENCOUNTER — Ambulatory Visit
Admission: RE | Admit: 2022-10-22 | Discharge: 2022-10-22 | Disposition: A | Discharge status: Home / Self Care | Payer: No Typology Code available for payment source | Source: Ambulatory Visit | Attending: Internal Medicine

## 2022-10-22 DIAGNOSIS — Z8249 Family history of ischemic heart disease and other diseases of the circulatory system: Secondary | ICD-10-CM

## 2022-10-25 ENCOUNTER — Ambulatory Visit: Payer: Self-pay | Admitting: Internal Medicine

## 2022-10-25 ENCOUNTER — Ambulatory Visit (HOSPITAL_COMMUNITY): Payer: No Typology Code available for payment source

## 2022-10-25 NOTE — Progress Notes (Signed)
normal

## 2022-10-26 NOTE — Progress Notes (Signed)
Called and spoke with patient regarding her CT cardiac results. Patient had additional questions, call transferred to Dr [facility].

## 2022-10-26 NOTE — Progress Notes (Signed)
Called patient, NA, LMAM

## 2022-10-29 ENCOUNTER — Ambulatory Visit: Payer: No Typology Code available for payment source | Admitting: Internal Medicine

## 2022-11-08 NOTE — Progress Notes (Signed)
No show

## 2022-11-09 NOTE — Progress Notes (Signed)
42 y.o. G17P2002 Married White or Caucasian Not Hispanic or Latino female here for annual exam.   Period Duration (Days): 6-10 Period Pattern: (!) Irregular Menstrual Flow: Moderate Menstrual Control:  (flex cup) Dysmenorrhea: (!) Mild Dysmenorrhea Symptoms: Cramping, Headache The cycle length is not a change. They are getting lighter overall and more irregular. Cycles range from every 3-4.5 weeks. Better in general.  She continues to have positional deep dyspareunia.   H/O hysteroscopy, polypectomy, D&C in 2/19 for AUB, benign pathology. H/O degenerating fibroid not in her cavity (3.8 cm). H/O migraine with aura. She has previously been counseled on mirena iud, ablation and hysterectomy.   No change in bowel habits, has a BM every 2-4 days. Goes from constipation to diarrhea. No urinary c/o   She c/o weight gain. She has had a restrictive diet, 1,200 calories a day, and gained weight. On thyroid medication, states TSH is normal. Discussed diet and exercise. Given # for the weight loss clinic.   Patient's last menstrual period was 11/05/2022.          Sexually active: Yes.    The current method of family planning is vasectomy.    Exercising: No.  The patient does not participate in regular exercise at present. Smoker:  no  Health Maintenance: Pap:  02/25/20 WNL Hr HPV Neg , 03/04/17 normal  History of abnormal Pap:  no MMG:  scheduled 12/06/22  BMD:   n/a Colonoscopy: n/a TDaP:  01/05/17  Gardasil: no has been counseled in the past    reports that she has never smoked. She has never used smokeless tobacco. She reports that she does not drink alcohol and does not use drugs. She works for [employer], Loss adjuster, chartered. Son is 33, daughter is 98.    Past Medical History:  Diagnosis Date   Dysrhythmia    PVC'S   Environmental allergies    Flu    FINISHED TAMIFLU 2/22   Hypothyroidism    Migraine with aura    PVC (premature ventricular contraction)    PVC (premature ventricular  contraction)    YEARS AGO    Past Surgical History:  Procedure Laterality Date   CESAREAN SECTION     x2   DILATATION & CURETTAGE/HYSTEROSCOPY WITH MYOSURE N/A 12/12/2017   Procedure: DILATATION & CURETTAGE/HYSTEROSCOPY;  Surgeon: Salvadore Dom, MD;  Location: [facility];  Service: Gynecology;  Laterality: N/A;   DILATION AND CURETTAGE OF UTERUS     HYSTEROSCOPY     WISDOM TOOTH EXTRACTION  2013    Current Outpatient Medications  Medication Sig Dispense Refill   Azelastine HCl 137 MCG/SPRAY SOLN SMARTSIG:1-2 Puff(s) Both Nares Twice Daily     Cholecalciferol (VITAMIN D3) 2000 units TABS Take 2,000 Units by mouth daily.     levocetirizine (XYZAL) 5 MG tablet Take 5 mg by mouth at bedtime.      levothyroxine (SYNTHROID, LEVOTHROID) 75 MCG tablet Take 75 mcg by mouth daily before breakfast.     Lysine 1000 MG TABS Take 1,000 mg by mouth daily.     metoprolol tartrate (LOPRESSOR) 25 MG tablet Take 12.5 mg by mouth 2 (two) times daily.      montelukast (SINGULAIR) 10 MG tablet Take 10 mg by mouth at bedtime.     Multiple Vitamin (MULTIVITAMIN) tablet Take 1 tablet by mouth daily.     topiramate (TOPAMAX) 100 MG tablet Take 100 mg by mouth 2 (two) times daily.      valACYclovir (VALTREX) 1000 MG tablet TAKE 2 TABLETS  BY MOUTH EVERY 12 HOURS AS NEEDED COLD SORES.  2   No current facility-administered medications for this visit.    Family History  Problem Relation Age of Onset   Heart attack Mother    Hypertension Mother    Kidney cancer Father        also in other parts of body now   Diabetes Maternal Grandmother        Type 1    Review of Systems  All other systems reviewed and are negative.   Exam:   BP 110/70   Pulse 66   Ht 5' 2.5" (1.588 m)   Wt 183 lb (83 kg)   LMP 11/05/2022   SpO2 100%   BMI 32.94 kg/m   Weight change: '@WEIGHTCHANGE'$ @ Height:   Height: 5' 2.5" (158.8 cm)  Ht Readings from Last 3 Encounters:  11/17/22 5' 2.5" (1.588 m)  08/20/22 5' 2.5" (1.588  m)  12/09/21 '5\' 2"'$  (1.575 m)    General appearance: alert, cooperative and appears stated age Head: Normocephalic, without obvious abnormality, atraumatic Neck: no adenopathy, supple, symmetrical, trachea midline and thyroid normal to inspection and palpation Lungs: clear to auscultation bilaterally Cardiovascular: regular rate and rhythm Breasts: normal appearance, no masses or tenderness Abdomen: soft, non-tender; non distended,  no masses,  no organomegaly Extremities: extremities normal, atraumatic, no cyanosis or edema Skin: Skin color, texture, turgor normal. No rashes or lesions Lymph nodes: Cervical, supraclavicular, and axillary nodes normal. No abnormal inguinal nodes palpated Neurologic: Grossly normal   Pelvic: External genitalia:  no lesions              Urethra:  normal appearing urethra with no masses, tenderness or lesions              Bartholins and Skenes: normal                 Vagina: normal appearing vagina with normal color and discharge, no lesions              Cervix: no lesions               Bimanual Exam:  Uterus:  normal size, contour, position, consistency, mobility, non-tender and anteverted. Top normal               Adnexa: no mass, fullness, tenderness               Rectovaginal: Confirms               Anus:  normal sphincter tone, no lesions    1. Well woman exam Discussed breast self exam Discussed calcium and vit D intake Labs with primary Mammogram scheduled Pap is utd

## 2022-11-17 ENCOUNTER — Ambulatory Visit (INDEPENDENT_AMBULATORY_CARE_PROVIDER_SITE_OTHER): Payer: No Typology Code available for payment source | Admitting: Obstetrics and Gynecology

## 2022-11-17 ENCOUNTER — Encounter: Payer: Self-pay | Admitting: Obstetrics and Gynecology

## 2022-11-17 VITALS — BP 110/70 | HR 66 | Ht 62.5 in | Wt 183.0 lb

## 2022-11-17 DIAGNOSIS — Z01419 Encounter for gynecological examination (general) (routine) without abnormal findings: Secondary | ICD-10-CM | POA: Diagnosis not present

## 2022-11-17 DIAGNOSIS — L821 Other seborrheic keratosis: Secondary | ICD-10-CM | POA: Insufficient documentation

## 2022-11-17 NOTE — Patient Instructions (Signed)

## 2022-12-06 ENCOUNTER — Ambulatory Visit
Admission: RE | Admit: 2022-12-06 | Discharge: 2022-12-06 | Disposition: A | Discharge status: Home / Self Care | Payer: No Typology Code available for payment source | Source: Ambulatory Visit | Attending: Obstetrics and Gynecology | Admitting: Obstetrics and Gynecology

## 2022-12-06 DIAGNOSIS — Z1231 Encounter for screening mammogram for malignant neoplasm of breast: Secondary | ICD-10-CM

## 2022-12-09 ENCOUNTER — Other Ambulatory Visit: Payer: Self-pay | Admitting: Obstetrics and Gynecology

## 2022-12-09 DIAGNOSIS — R928 Other abnormal and inconclusive findings on diagnostic imaging of breast: Secondary | ICD-10-CM

## 2022-12-23 ENCOUNTER — Ambulatory Visit
Admission: RE | Admit: 2022-12-23 | Discharge: 2022-12-23 | Disposition: A | Discharge status: Home / Self Care | Payer: No Typology Code available for payment source | Source: Ambulatory Visit | Attending: Obstetrics and Gynecology | Admitting: Obstetrics and Gynecology

## 2022-12-23 DIAGNOSIS — R928 Other abnormal and inconclusive findings on diagnostic imaging of breast: Secondary | ICD-10-CM

## 2023-08-24 IMAGING — DX DG CHEST 2V
2 series · 2 of 2 positions shown · non-contrast
Comparison: Chest x-ray 08/12/2012

CLINICAL DATA: Shortness of breath.  One week post COVID.

EXAM:
CHEST - 2 VIEW

[chest pa]
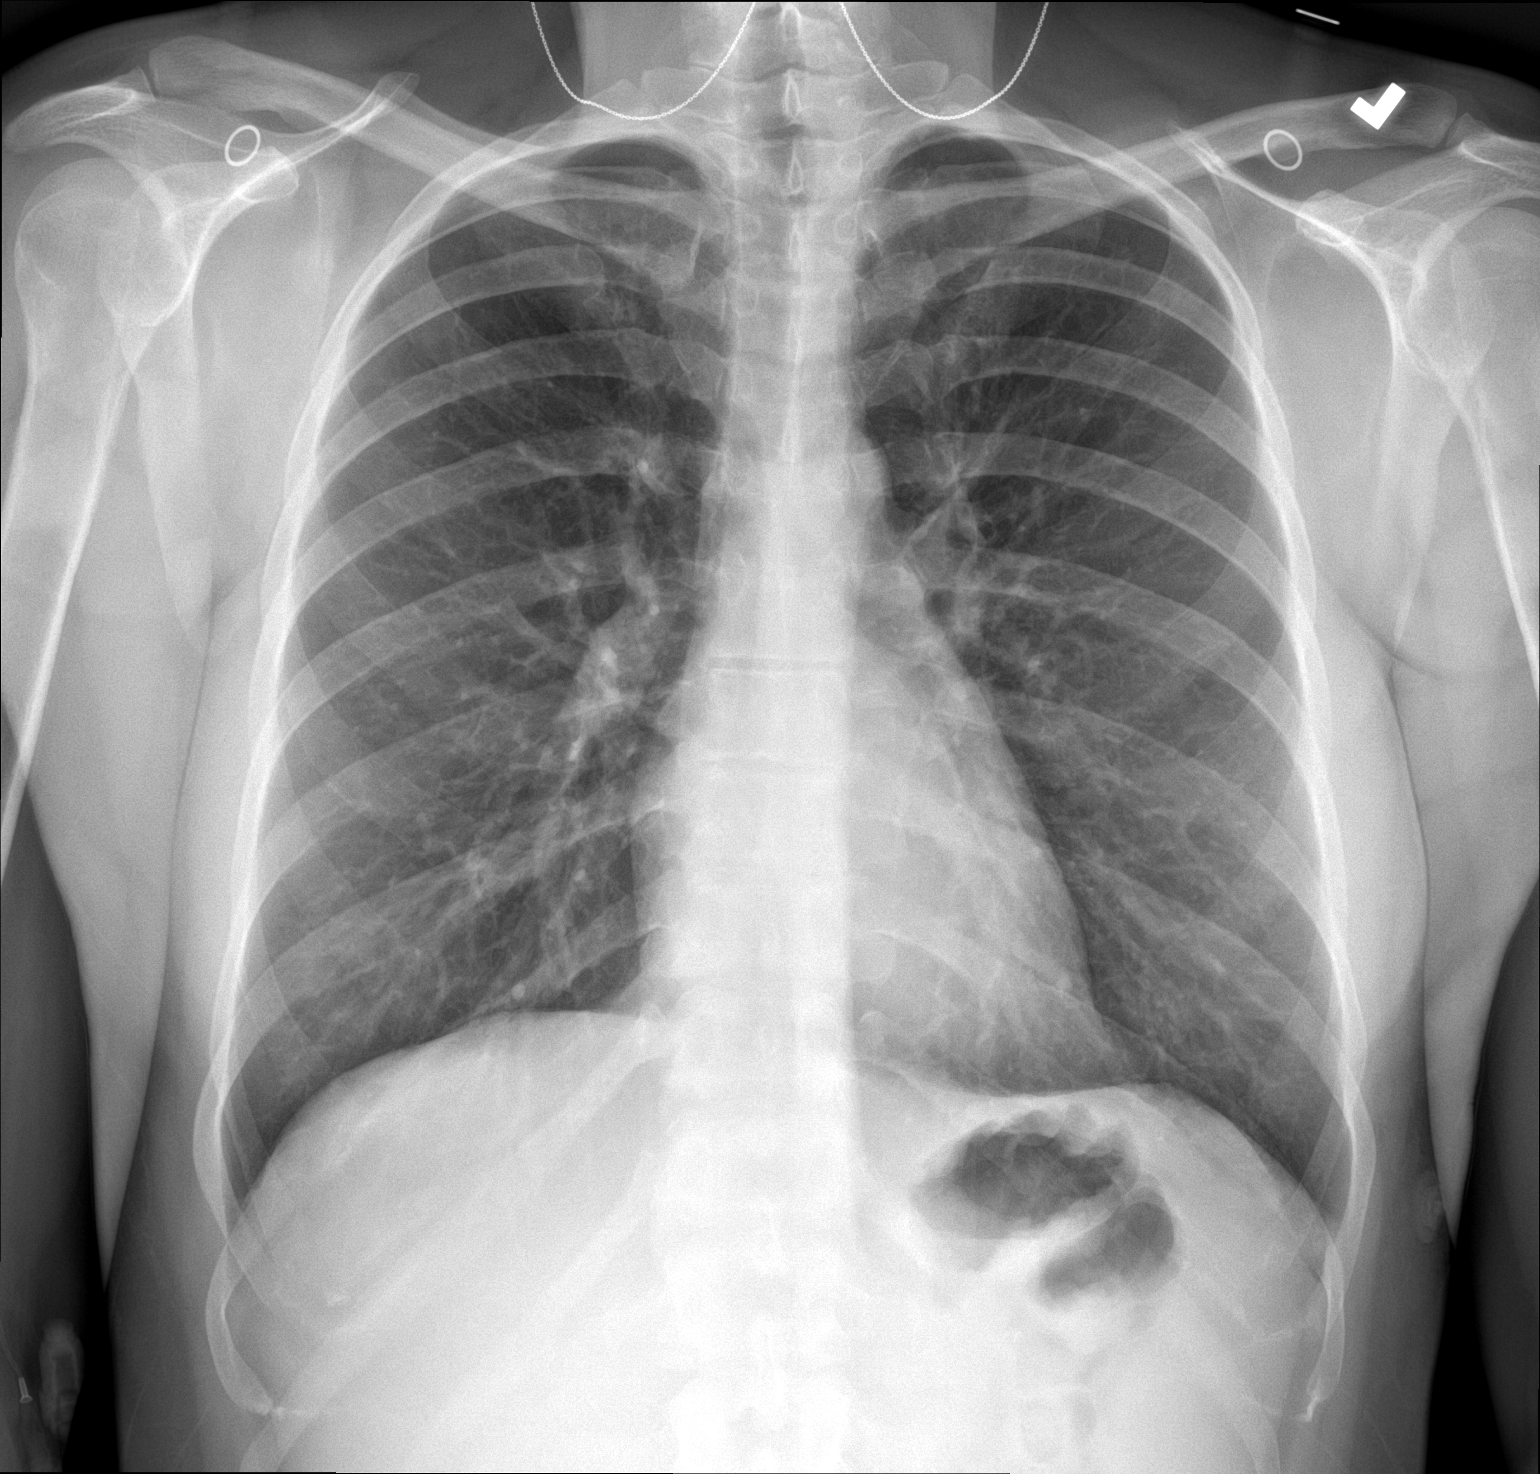

[chest lat]
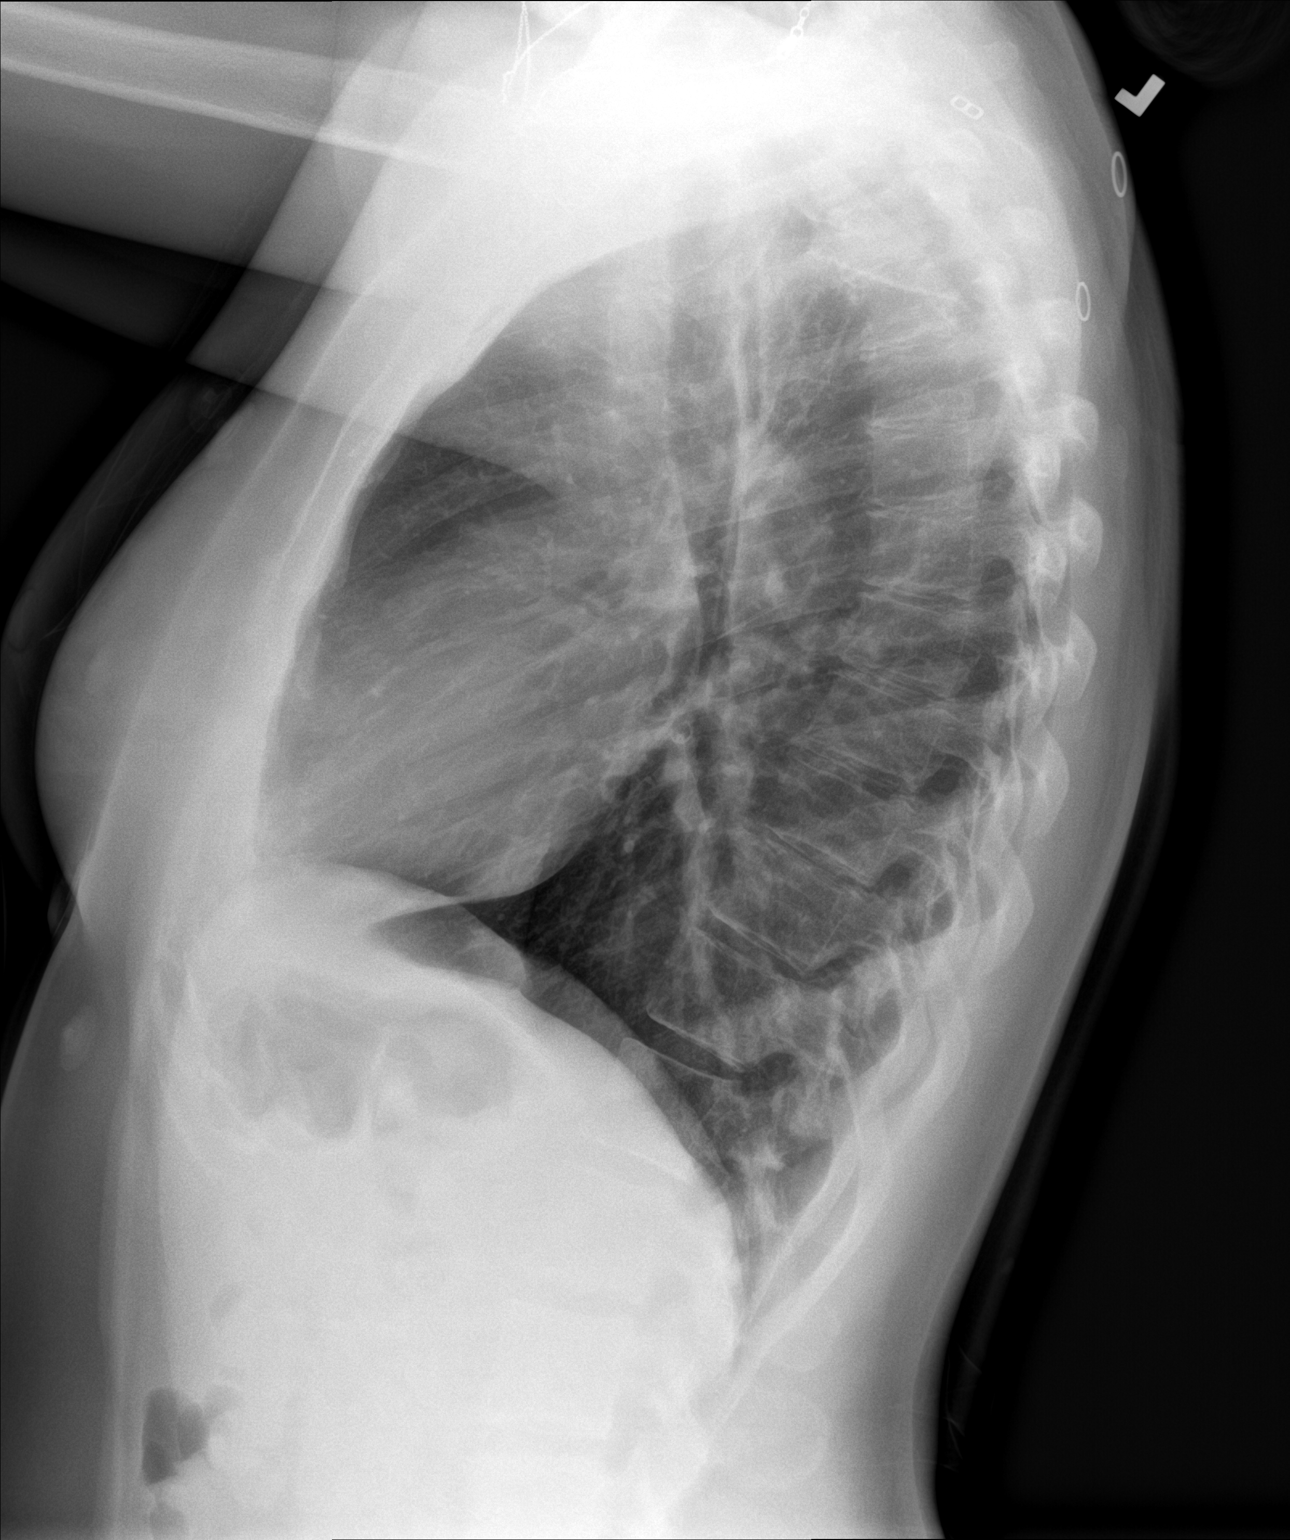

[2 of 2 positions shown; findings below may reference images not displayed]

FINDINGS: The heart size and mediastinal contours are within normal limits.
Both lungs are clear. The visualized skeletal structures are
unremarkable.
IMPRESSION: No active cardiopulmonary disease.

## 2024-04-19 ENCOUNTER — Other Ambulatory Visit (HOSPITAL_BASED_OUTPATIENT_CLINIC_OR_DEPARTMENT_OTHER): Payer: Self-pay | Admitting: Family Medicine

## 2024-04-19 DIAGNOSIS — R109 Unspecified abdominal pain: Secondary | ICD-10-CM

## 2024-04-29 ENCOUNTER — Ambulatory Visit (HOSPITAL_BASED_OUTPATIENT_CLINIC_OR_DEPARTMENT_OTHER)
Admission: RE | Admit: 2024-04-29 | Discharge: 2024-04-29 | Disposition: A | Discharge status: Home / Self Care | Source: Ambulatory Visit | Attending: Family Medicine | Admitting: Family Medicine

## 2024-04-29 DIAGNOSIS — R109 Unspecified abdominal pain: Secondary | ICD-10-CM | POA: Diagnosis present

## 2024-04-29 LAB — POCT I-STAT CREATININE: Creatinine, Ser: 0.8 mg/dL (ref 0.44–1.00)

## 2024-04-29 MED ORDER — IOHEXOL 300 MG/ML  SOLN
100.0000 mL | Freq: Once | INTRAMUSCULAR | Status: AC | PRN
  Administered 2024-04-29: 100 mL via INTRAVENOUS

## 2024-05-03 ENCOUNTER — Encounter: Payer: Self-pay | Admitting: Obstetrics and Gynecology

## 2024-05-03 ENCOUNTER — Ambulatory Visit (INDEPENDENT_AMBULATORY_CARE_PROVIDER_SITE_OTHER): Admitting: Obstetrics and Gynecology

## 2024-05-03 VITALS — BP 116/62 | HR 83 | Temp 98.2°F | Wt 182.0 lb

## 2024-05-03 DIAGNOSIS — D252 Subserosal leiomyoma of uterus: Secondary | ICD-10-CM | POA: Diagnosis not present

## 2024-05-03 DIAGNOSIS — N939 Abnormal uterine and vaginal bleeding, unspecified: Secondary | ICD-10-CM

## 2024-05-03 NOTE — Progress Notes (Signed)
 43 y.o. G62P2002 female with CD x2, known fibroid, hypothyroidism, palpitations here for referral for fibroids. Married, vasectomy. Works hybrid schedule from [employer]. Former patient of Dr. Jertson.  Patient's last menstrual period was 04/16/2024 (approximate). Period Duration (Days): 7-10 Period Pattern: Regular Menstrual Flow: Heavy Menstrual Control: Other (Comment) (menstrual cup) Dysmenorrhea: (!) Moderate Dysmenorrhea Symptoms: Headache  She reports heavy, long, irregular periods. HVB on CD1-5, using flex cup, self emptying, +clots  She can feel her fibroid. She is also noting bloating and early satiety. She has completed childbearing and desires hysterectomy.  04/29/24 CT a/p: IMPRESSION: 1. Mild right-sided hydronephrosis with abrupt narrowing at the UPJ. No obstructive ureteral calculi present. This may reflect changes of an underlying ureteral stricture and a follow-up CT urogram may be of benefit for further characterization. 2. Enlarged uterus containing a large, heterogeneously enhancing intramural or subserosal fibroid along the cranial aspect of the uterine body, measuring 9.7 x 9.6 x 14 cm.  Reviewed 12/29/2017 TVUS after hysteroscopy, D&C  which revealed a 4 cm degenerating subserosal fibroid.  Has PVCs on metoprolol. No current CP or SOB.  GYN HISTORY: CD x2  OB History  Gravida Para Term Preterm AB Living  2 2 2   2   SAB IAB Ectopic Multiple Live Births      2    # Outcome Date GA Lbr Len/2nd Weight Sex Type Anes PTL Lv  2 Term 02/2009 [redacted]w[redacted]d  7 lb 15 oz (3.6 kg) F CS-Unspec   LIV  1 Term 12/2006 [redacted]w[redacted]d  8 lb 11 oz (3.941 kg) M CS-Unspec   LIV   Past Medical History:  Diagnosis Date   Dysrhythmia    PVC'S   Environmental allergies    Flu    FINISHED TAMIFLU 2/22   Hypothyroidism    Migraine with aura    PVC (premature ventricular contraction)    PVC (premature ventricular contraction)    YEARS AGO   Past Surgical History:  Procedure Laterality Date    CESAREAN SECTION     x2   DILATATION & CURETTAGE/HYSTEROSCOPY WITH MYOSURE N/A 12/12/2017   Procedure: DILATATION & CURETTAGE/HYSTEROSCOPY;  Surgeon: Jannis Kate Norris, MD;  Location: [facility];  Service: Gynecology;  Laterality: N/A;   WISDOM TOOTH EXTRACTION  2013   Current Outpatient Medications on File Prior to Visit  Medication Sig Dispense Refill   Azelastine HCl 137 MCG/SPRAY SOLN SMARTSIG:1-2 Puff(s) Both Nares Twice Daily     Cholecalciferol (VITAMIN D3) 2000 units TABS Take 2,000 Units by mouth daily.     levocetirizine (XYZAL) 5 MG tablet Take 5 mg by mouth at bedtime.      levothyroxine (SYNTHROID, LEVOTHROID) 75 MCG tablet Take 75 mcg by mouth daily before breakfast.     Lysine 1000 MG TABS Take 1,000 mg by mouth daily.     metoprolol tartrate (LOPRESSOR) 25 MG tablet Take 12.5 mg by mouth 2 (two) times daily.      montelukast (SINGULAIR) 10 MG tablet Take 10 mg by mouth at bedtime.     Multiple Vitamin (MULTIVITAMIN) tablet Take 1 tablet by mouth daily.     topiramate (TOPAMAX) 100 MG tablet Take 100 mg by mouth 2 (two) times daily.      valACYclovir (VALTREX) 1000 MG tablet TAKE 2 TABLETS BY MOUTH EVERY 12 HOURS AS NEEDED COLD SORES.  2   No current facility-administered medications on file prior to visit.   Allergies  Allergen Reactions   Zithromax [Azithromycin] Other (See Comments)  PE Today's Vitals   05/03/24 0903  BP: 116/62  Pulse: 83  Temp: 98.2 F (36.8 C)  TempSrc: Oral  SpO2: 99%  Weight: 182 lb (82.6 kg)   Body mass index is 32.76 kg/m.  Physical Exam Vitals reviewed. Exam conducted with a chaperone present.  Constitutional:      General: She is not in acute distress.    Appearance: Normal appearance.  HENT:     Head: Normocephalic and atraumatic.     Nose: Nose normal.  Eyes:     Extraocular Movements: Extraocular movements intact.     Conjunctiva/sclera: Conjunctivae normal.  Pulmonary:     Effort: Pulmonary effort is normal.   Genitourinary:    General: Normal vulva.     Exam position: Lithotomy position.     Vagina: Normal. No vaginal discharge.     Cervix: Normal. No cervical motion tenderness, discharge or lesion.     Uterus: Normal. Enlarged. Not tender.      Adnexa: Right adnexa normal and left adnexa normal.  Musculoskeletal:        General: Normal range of motion.     Cervical back: Normal range of motion.  Neurological:     General: No focal deficit present.     Mental Status: She is alert.  Psychiatric:        Mood and Affect: Mood normal.        Behavior: Behavior normal.      Assessment and Plan:        Subserous leiomyoma of uterus Assessment & Plan: Reviewed CT with interval growth of known fibroid, also with UPJ narrowing and AUB.  Discussed pathology of fibroids and benign nature. Reviewed that management of fibroids is dependent upon associated symptoms. Asymptomatic fibroids can be managed expectantly. However, fibroids can cause AUB and bulk symptoms, including dysmenorrhea, pelvic and lower back pain and pressure, dyspareunia, and constipation.  Patient desires surgical management and has completed childbearing.  She has elected for hysterectomy. Plan for robotic assisted total laparoscopic hysterectomy and bilateral salpingectomy, cystoscopy. Discussed outpatient procedure. Reviewed that  recovery is usually 6 weeks with additional 4 weeks of pelvic rest. Risks including infections, bleeding, and damage to surrounding organs reviewed.  Patient is agreeable to blood transfusion in the event of an emergency.  Patient in agreement with initial plan. Orders placed for surgery. RTO for EMB with preop visit.  Additional clearance: cards for palpitations on metoprolol Tubal and hysterectomy papers signed: N/A  Additionally would recommend Ct urogram to r/o urologic causes of UPJ narrowing.    Orders: -     Ambulatory Referral For Surgery Scheduling -     Endometrial biopsy;  Future  Abnormal uterine bleeding (AUB)    Vera LULLA Pa, MD

## 2024-05-03 NOTE — Assessment & Plan Note (Addendum)
 Reviewed CT with interval growth of known fibroid, also with UPJ narrowing and AUB.  Discussed pathology of fibroids and benign nature. Reviewed that management of fibroids is dependent upon associated symptoms. Asymptomatic fibroids can be managed expectantly. However, fibroids can cause AUB and bulk symptoms, including dysmenorrhea, pelvic and lower back pain and pressure, dyspareunia, and constipation.  Patient desires surgical management and has completed childbearing.  She has elected for hysterectomy. Plan for robotic assisted total laparoscopic hysterectomy and bilateral salpingectomy, cystoscopy. Discussed outpatient procedure. Reviewed that  recovery is usually 6 weeks with additional 4 weeks of pelvic rest. Risks including infections, bleeding, and damage to surrounding organs reviewed.  Patient is agreeable to blood transfusion in the event of an emergency.  Patient in agreement with initial plan. Orders placed for surgery. RTO for EMB with preop visit.  Additional clearance: cards for palpitations on metoprolol Tubal and hysterectomy papers signed: N/A  Additionally would recommend Ct urogram to r/o urologic causes of UPJ narrowing.

## 2024-05-08 ENCOUNTER — Telehealth: Payer: Self-pay

## 2024-05-08 ENCOUNTER — Other Ambulatory Visit (HOSPITAL_BASED_OUTPATIENT_CLINIC_OR_DEPARTMENT_OTHER): Payer: Self-pay | Admitting: Family Medicine

## 2024-05-08 DIAGNOSIS — R002 Palpitations: Secondary | ICD-10-CM

## 2024-05-08 DIAGNOSIS — N1330 Unspecified hydronephrosis: Secondary | ICD-10-CM

## 2024-05-08 NOTE — Telephone Encounter (Signed)
 Patient called & stated she talked to someone at the imaging facility & they originally told her they were doing a CT abdomen & pelvic scan. She told them she had that done already. They looked at your notes & saw that you wanted CT renal stone study imaging done. They told her it was without contrast. Patient states she is confused because she thought you wanted it with contrast.  Also patient said you told her you would put in a referral for cardiology for her. She called her previous cardiology office at [facility] but has been getting a busy signal for the past few days. She is asking if you can put in a referral for somewhere else.   Please advise.

## 2024-05-10 NOTE — Telephone Encounter (Signed)
 Patient called for update on imaging. Patient states she is scheduled for a CT abd pelvis without contrast 05/13/24. Patient states she was seen in the office on 05/03/24, CT urogram discussed and recommendations to be sent to Dr. Lenora to be ordered.   Patient is scheduled for CT ABD Pelv w/o contrast, order for CT renal study by Dr. Johnita Betters. Patient wants to be certain she is not repeating same test as she did on 7/13 due to cost concerns.   Advised CT 04/29/24 was a CT ABD Pelvis with contrast.   Advised I will send to Dr. Dallie to confirm imaging and f/u. Patient states she has already contacted Dr. Anna office.   Spoke with Alicia at [facility], was advised CT ABD Pelvis without contrast is the correct imaging for CT renal study, No IV, nothing PO.   Dr. Dallie -please review and advise if ok to proceed as scheduled for CT per conversation during OV.   2. Patient is requesting referral to another cardiologist. Patient is currently established at [facility], patient has tried to contact their office, line is busy. Advised I also tried x3 yesterday, busy and no fax or alternative number. I asked if patient was close to that location where she could stop by, she said no. Advised I will review with Dr. Dallie and f/u.

## 2024-05-11 NOTE — Telephone Encounter (Signed)
 Spoke with patient, advised per Dr. Dallie. Patient would like to proceed with referral to cardiology, order placed. Advised their office will contact her directly to schedule. Patient will try to f/u with [facility] in the meantime and return call if any additional information provided.   Routing to [employer] on referral.   Encounter closed.

## 2024-05-11 NOTE — Telephone Encounter (Addendum)
 Will defer renal imaging to radiology department and PCP. Okay to proceed as scheduled.  Okay to send amb ref to cardiology at  for palpitation and preop cardiac risk evaluation.

## 2024-05-13 ENCOUNTER — Ambulatory Visit (HOSPITAL_BASED_OUTPATIENT_CLINIC_OR_DEPARTMENT_OTHER)
Admission: RE | Admit: 2024-05-13 | Discharge: 2024-05-13 | Disposition: A | Discharge status: Home / Self Care | Source: Ambulatory Visit | Attending: Family Medicine | Admitting: Family Medicine

## 2024-05-13 DIAGNOSIS — N1330 Unspecified hydronephrosis: Secondary | ICD-10-CM | POA: Diagnosis present

## 2024-05-16 ENCOUNTER — Telehealth: Payer: Self-pay

## 2024-05-16 NOTE — Telephone Encounter (Signed)
   Name: Christie Barron  DOB: December 11, 1980  MRN: [id number]  Primary Cardiologist: None  Chart reviewed as part of pre-operative protocol coverage. Because of [name]'s past medical history and time since last visit, she will require a follow-up in-office visit in order to better assess preoperative cardiovascular risk. Needs to be established with cardiology.  Saw Dr. Custovic in January 2024, and follow up was no-show.   Pre-op covering staff: - Please schedule appointment and call patient to inform them. If patient already had an upcoming appointment within acceptable timeframe, please add pre-op clearance to the appointment notes so provider is aware. - Please contact requesting surgeon's office via preferred method (i.e, phone, fax) to inform them of need for appointment prior to surgery.  Lamarr Satterfield, NP  05/16/2024, 3:30 PM

## 2024-05-16 NOTE — Telephone Encounter (Signed)
   Pre-operative Risk Assessment    Patient Name: Christie Barron  DOB: 17-May-1981 MRN: [id number]   Date of last office visit: NONE (PREVIOUS PT AT [facility]) Date of next office visit: NONE   Request for Surgical Clearance    Procedure:  ROBOTIC ASSISTED HYSTERECTOMY, BILATERAL SALPINGECTOMY, CYSTOSCOPY   Date of Surgery:  Clearance TBD                                Surgeon:  DR DALLIE Socks Group or Practice Name:  [facility] [place] Phone number:  [phone] Fax number:  [phone]   Type of Clearance Requested:   - Medical    Type of Anesthesia:  General    Additional requests/questions:    Signed, Lucie DELENA Ku   05/16/2024, 3:21 PM

## 2024-05-16 NOTE — Telephone Encounter (Signed)
 Pt has been scheduled HF1st new pt preop appt Christie Barron, [facility] 05/29/24 11 am.

## 2024-05-17 DIAGNOSIS — N939 Abnormal uterine and vaginal bleeding, unspecified: Secondary | ICD-10-CM | POA: Insufficient documentation

## 2024-05-17 NOTE — Telephone Encounter (Signed)
 Routing to Dr. Dallie to review CT scan.

## 2024-05-22 ENCOUNTER — Encounter: Payer: Self-pay | Admitting: Obstetrics and Gynecology

## 2024-05-22 ENCOUNTER — Other Ambulatory Visit (HOSPITAL_COMMUNITY)
Admission: RE | Admit: 2024-05-22 | Discharge: 2024-05-22 | Disposition: A | Discharge status: Home / Self Care | Source: Ambulatory Visit | Attending: Obstetrics and Gynecology | Admitting: Obstetrics and Gynecology

## 2024-05-22 ENCOUNTER — Ambulatory Visit: Admitting: Obstetrics and Gynecology

## 2024-05-22 VITALS — BP 116/64 | HR 75 | Temp 98.2°F | Wt 182.0 lb

## 2024-05-22 DIAGNOSIS — N939 Abnormal uterine and vaginal bleeding, unspecified: Secondary | ICD-10-CM | POA: Diagnosis present

## 2024-05-22 DIAGNOSIS — N1330 Unspecified hydronephrosis: Secondary | ICD-10-CM | POA: Diagnosis not present

## 2024-05-22 DIAGNOSIS — Z01812 Encounter for preprocedural laboratory examination: Secondary | ICD-10-CM

## 2024-05-22 DIAGNOSIS — D252 Subserosal leiomyoma of uterus: Secondary | ICD-10-CM | POA: Insufficient documentation

## 2024-05-22 LAB — CBC
HCT: 39.2 % (ref 35.0–45.0)
Hemoglobin: 12.2 g/dL (ref 11.7–15.5)
MCH: 27.4 pg (ref 27.0–33.0)
MCHC: 31.1 g/dL — ABNORMAL LOW (ref 32.0–36.0)
MCV: 88.1 fL (ref 80.0–100.0)
MPV: 10.2 fL (ref 7.5–12.5)
Platelets: 302 Thousand/uL (ref 140–400)
RBC: 4.45 Million/uL (ref 3.80–5.10)
RDW: 14.2 % (ref 11.0–15.0)
WBC: 6.6 Thousand/uL (ref 3.8–10.8)

## 2024-05-22 LAB — PREGNANCY, URINE: Preg Test, Ur: NEGATIVE

## 2024-05-22 NOTE — Assessment & Plan Note (Signed)
 Reviewed CT with interval growth of known fibroid, also with UPJ narrowing and AUB. CT urogram with BL mild hydronephrosis thought to be related to fibroid bulk  Discussed pathology of fibroids and benign nature. Reviewed that management of fibroids is dependent upon associated symptoms. Asymptomatic fibroids can be managed expectantly. However, fibroids can cause AUB and bulk symptoms, including dysmenorrhea, pelvic and lower back pain and pressure, dyspareunia, and constipation.  Patient desires surgical management and has completed childbearing.  She has elected for hysterectomy, ordered placed at last appt. Plan for robotic assisted total laparoscopic hysterectomy and bilateral salpingectomy, cystoscopy. Discussed outpatient procedure. Reviewed that  recovery is usually 6 weeks with additional 4 weeks of pelvic rest. Risks including infections, bleeding, and damage to surrounding organs reviewed.  Patient is agreeable to blood transfusion in the event of an emergency.  Patient in agreement with initial plan.  Additional clearance: cards for palpitations on metoprolol, appt 05/29/24 Tubal and hysterectomy papers signed: N/A

## 2024-05-22 NOTE — Assessment & Plan Note (Addendum)
 As noted below Uncx EMB today Will check CBC and BMP

## 2024-05-22 NOTE — Progress Notes (Signed)
 43 y.o. G31P2002 female with CD x2, AUB, known fibroid, hypothyroidism, palpitations here for EMB. Married, vasectomy. Presents with her husband. Works hybrid schedule from [employer]. Former patient of Dr. Jertson.  Patient's last menstrual period was 05/11/2024 (approximate). Period Duration (Days): 8 Period Pattern: Regular Menstrual Flow: Heavy Menstrual Control:  (menstrual cup) Dysmenorrhea: (!) Mild Dysmenorrhea Symptoms: Headache She reports heavy, long, irregular periods. HVB on CD1-5, using flex cup, self emptying, +clots  She can feel her fibroid. She is also noting bloating and early satiety. She has completed childbearing and desires hysterectomy. Notes left sided sciatica and left mid back pain.  Has PVCs on metoprolol, last seen by cards Jan 2024. No current CP or SOB.  04/29/24 CT a/p: IMPRESSION: 1. Mild right-sided hydronephrosis with abrupt narrowing at the UPJ. No obstructive ureteral calculi present. This may reflect changes of an underlying ureteral stricture and a follow-up CT urogram may be of benefit for further characterization. 2. Enlarged uterus containing a large, heterogeneously enhancing intramural or subserosal fibroid along the cranial aspect of the uterine body, measuring 9.7 x 9.6 x 14 cm.  Reviewed 12/29/2017 TVUS after hysteroscopy, D&C  which revealed a 4 cm degenerating subserosal fibroid.  GYN HISTORY: CD x2  OB History  Gravida Para Term Preterm AB Living  2 2 2   2   SAB IAB Ectopic Multiple Live Births      2    # Outcome Date GA Lbr Len/2nd Weight Sex Type Anes PTL Lv  2 Term 02/2009 [redacted]w[redacted]d  7 lb 15 oz (3.6 kg) F CS-Unspec   LIV  1 Term 12/2006 [redacted]w[redacted]d  8 lb 11 oz (3.941 kg) M CS-Unspec   LIV   Past Medical History:  Diagnosis Date   Dysrhythmia    PVC'S   Environmental allergies    Flu    FINISHED TAMIFLU 2/22   Hypothyroidism    Migraine with aura    PVC (premature ventricular contraction)    PVC (premature ventricular contraction)     YEARS AGO   Past Surgical History:  Procedure Laterality Date   CESAREAN SECTION     x2   DILATATION & CURETTAGE/HYSTEROSCOPY WITH MYOSURE N/A 12/12/2017   Procedure: DILATATION & CURETTAGE/HYSTEROSCOPY;  Surgeon: Jannis Kate Norris, MD;  Location: [facility];  Service: Gynecology;  Laterality: N/A;   WISDOM TOOTH EXTRACTION  2013   Current Outpatient Medications on File Prior to Visit  Medication Sig Dispense Refill   Azelastine HCl 137 MCG/SPRAY SOLN SMARTSIG:1-2 Puff(s) Both Nares Twice Daily     Cholecalciferol (VITAMIN D3) 2000 units TABS Take 2,000 Units by mouth daily.     levocetirizine (XYZAL) 5 MG tablet Take 5 mg by mouth at bedtime.      levothyroxine (SYNTHROID, LEVOTHROID) 75 MCG tablet Take 75 mcg by mouth daily before breakfast.     Lysine 1000 MG TABS Take 1,000 mg by mouth daily.     metoprolol tartrate (LOPRESSOR) 25 MG tablet Take 12.5 mg by mouth 2 (two) times daily.      montelukast (SINGULAIR) 10 MG tablet Take 10 mg by mouth at bedtime.     Multiple Vitamin (MULTIVITAMIN) tablet Take 1 tablet by mouth daily.     topiramate (TOPAMAX) 100 MG tablet Take 100 mg by mouth 2 (two) times daily.      valACYclovir (VALTREX) 1000 MG tablet TAKE 2 TABLETS BY MOUTH EVERY 12 HOURS AS NEEDED COLD SORES.  2   No current facility-administered medications on file prior to visit.  Allergies  Allergen Reactions   Zithromax [Azithromycin] Other (See Comments)      PE Today's Vitals   05/22/24 1506  BP: 116/64  Pulse: 75  Temp: 98.2 F (36.8 C)  TempSrc: Oral  SpO2: 98%  Weight: 182 lb (82.6 kg)    Body mass index is 32.76 kg/m.  Physical Exam Vitals reviewed. Exam conducted with a chaperone present.  Constitutional:      General: She is not in acute distress.    Appearance: Normal appearance.  HENT:     Head: Normocephalic and atraumatic.     Nose: Nose normal.  Eyes:     Extraocular Movements: Extraocular movements intact.     Conjunctiva/sclera:  Conjunctivae normal.  Cardiovascular:     Rate and Rhythm: Normal rate and regular rhythm.     Heart sounds: No murmur heard.    No friction rub. No gallop.  Pulmonary:     Effort: Pulmonary effort is normal. No respiratory distress.     Breath sounds: Normal breath sounds. No stridor. No wheezing, rhonchi or rales.  Abdominal:     Palpations: There is mass.     Comments: Fibroid palpated 2 finger breadths below umbilicus  Genitourinary:    General: Normal vulva.     Exam position: Lithotomy position.     Vagina: Normal. No vaginal discharge.     Cervix: Normal. No cervical motion tenderness, discharge or lesion.     Uterus: Normal. Enlarged. Not tender.      Adnexa: Right adnexa normal and left adnexa normal.     Comments: Uterus and fibroid mobile, good posterior space Musculoskeletal:        General: Normal range of motion.       Arms:     Cervical back: Normal range of motion.  Neurological:     General: No focal deficit present.     Mental Status: She is alert.  Psychiatric:        Mood and Affect: Mood normal.        Behavior: Behavior normal.    Procedure Consent was signed. Timeout was performed. Speculum inserted into the vagina, cervix visualized and was prepped with Betadine.  Cervical block was declined. A single-toothed tenaculum was placed on the anterior lip of the cervix to stabilize it.  The 3 mm pipelle was introduced into the endometrial cavity without difficulty to a depth of 8cm, suction initiated and a moderate amount of tissue was obtained and sent to pathology.  The instruments were removed from the patient's vagina.  Minimal bleeding from the cervix was noted.  The patient tolerated the procedure well.     Assessment and Plan:        Abnormal uterine bleeding (AUB) Assessment & Plan: As noted below Uncx EMB today Will check CBC and BMP  Orders: -     CBC -     Surgical pathology  Subserous leiomyoma of uterus Assessment &  Plan: Reviewed CT with interval growth of known fibroid, also with UPJ narrowing and AUB. CT urogram with BL mild hydronephrosis thought to be related to fibroid bulk  Discussed pathology of fibroids and benign nature. Reviewed that management of fibroids is dependent upon associated symptoms. Asymptomatic fibroids can be managed expectantly. However, fibroids can cause AUB and bulk symptoms, including dysmenorrhea, pelvic and lower back pain and pressure, dyspareunia, and constipation.  Patient desires surgical management and has completed childbearing.  She has elected for hysterectomy, ordered placed at last appt. Plan for robotic  assisted total laparoscopic hysterectomy and bilateral salpingectomy, cystoscopy. Discussed outpatient procedure. Reviewed that  recovery is usually 6 weeks with additional 4 weeks of pelvic rest. Risks including infections, bleeding, and damage to surrounding organs reviewed.  Patient is agreeable to blood transfusion in the event of an emergency.  Patient in agreement with initial plan.  Additional clearance: cards for palpitations on metoprolol, appt 05/29/24 Tubal and hysterectomy papers signed: N/A   Orders: -     Surgical pathology  Hydronephrosis, unspecified hydronephrosis type -     Basic metabolic panel with GFR  Pre-procedure lab exam -     Pregnancy, urine     Vera LULLA Pa, MD

## 2024-05-22 NOTE — Patient Instructions (Addendum)
 It is common to have vaginal bleeding and cramping for up to 72 hours after your biopsy. Please call our office with heavy vaginal bleeding, severe abdominal pain or fever. Avoid intercourse, tampon use, douching and baths for 7 days to decrease the risk of infection.

## 2024-05-23 ENCOUNTER — Ambulatory Visit: Payer: Self-pay | Admitting: Obstetrics and Gynecology

## 2024-05-23 LAB — BASIC METABOLIC PANEL WITH GFR
BUN: 15 mg/dL (ref 7–25)
CO2: 22 mmol/L (ref 20–32)
Calcium: 9.8 mg/dL (ref 8.6–10.2)
Chloride: 106 mmol/L (ref 98–110)
Creat: 0.91 mg/dL (ref 0.50–0.99)
Glucose, Bld: 96 mg/dL (ref 65–99)
Potassium: 4.8 mmol/L (ref 3.5–5.3)
Sodium: 141 mmol/L (ref 135–146)
eGFR: 81 mL/min/1.73m2 (ref >=60)

## 2024-05-24 LAB — SURGICAL PATHOLOGY

## 2024-05-25 ENCOUNTER — Encounter: Payer: Self-pay | Admitting: Cardiology

## 2024-05-25 ENCOUNTER — Ambulatory Visit

## 2024-05-25 ENCOUNTER — Ambulatory Visit: Attending: Cardiology | Admitting: Cardiology

## 2024-05-25 VITALS — BP 118/68 | HR 88 | Ht 62.0 in | Wt 182.6 lb

## 2024-05-25 DIAGNOSIS — I493 Ventricular premature depolarization: Secondary | ICD-10-CM | POA: Diagnosis not present

## 2024-05-25 DIAGNOSIS — R002 Palpitations: Secondary | ICD-10-CM | POA: Diagnosis not present

## 2024-05-25 DIAGNOSIS — Z01810 Encounter for preprocedural cardiovascular examination: Secondary | ICD-10-CM

## 2024-05-25 NOTE — Patient Instructions (Signed)
 Testing/Procedures: 2 week zio monitor  Your physician has requested that you wear a Zio heart monitor for __14___ days. This will be mailed to your home with instructions on how to apply the monitor and how to return it when finished. Please allow 2 weeks after returning the heart monitor before our office calls you with the results.  Echo  Your physician has requested that you have an echocardiogram. Echocardiography is a painless test that uses sound waves to create images of your heart. It provides your doctor with information about the size and shape of your heart and how well your heart's chambers and valves are working. This procedure takes approximately one hour. There are no restrictions for this procedure. Please do NOT wear cologne, perfume, aftershave, or lotions (deodorant is allowed). Please arrive 15 minutes prior to your appointment time.  Please note: We ask at that you not bring children with you during ultrasound (echo/ vascular) testing. Due to room size and safety concerns, children are not allowed in the ultrasound rooms during exams. Our front office staff cannot provide observation of children in our lobby area while testing is being conducted. An adult accompanying a patient to their appointment will only be allowed in the ultrasound room at the discretion of the ultrasound technician under special circumstances. We apologize for any inconvenience.   Follow-Up: At [facility], you and your health needs are our priority.  As part of our continuing mission to provide you with exceptional heart care, our providers are all part of one team.  This team includes your primary Cardiologist (physician) and Advanced Practice Providers or APPs (Physician Assistants and Nurse Practitioners) who all work together to provide you with the care you need, when you need it.  Your next appointment:   1 year(s)  Provider:   One of our Advanced Practice Providers (APPs): Morse Clause, PA-C  Lamarr Satterfield, NP Miriam Shams, NP  Olivia Pavy, PA-C Josefa Beauvais, NP  Leontine Salen, PA-C Orren Fabry, PA-C  [place], [place] Jackee Alberts, NP  Damien Braver, NP Jon Hails, PA-C  Waddell Donath, PA-C    Dayna Dunn, PA-C  Glendia Ferrier, PA-C Lum Louis, NP Katlyn West, NP Callie Goodrich, PA-C  Evan Williams, PA-C Sheng Haley, PA-C  Xika Zhao, NP Kathleen Johnson, PA-C

## 2024-05-25 NOTE — Progress Notes (Unsigned)
 Applied a 14 day Zio XT monitor to patient in the office ?

## 2024-05-25 NOTE — Progress Notes (Signed)
  Cardiology Office Note:  .   Date:  05/25/2024  ID:  Christie Barron, DOB August 27, 1981, MRN [id number] PCP: Cleotilde Planas, MD  [facility] Providers Cardiologist:  Newman Lawrence, MD PCP: Cleotilde Planas, MD  Chief Complaint  Patient presents with   Palpitations   Family H/o of early CAD     Christie Barron is a 43 y.o. female with palpitations, family h/o early CAD   History of Present Illness  Patient is going to undergo hysterectomy due to large fibroid.  She has had complaints of palpitations, previously deemed as PVCs.  Symptoms occur several times daily, sometimes lasting several minutes.  Any chest pain or shortness of breath symptoms.     Vitals:   05/25/24 1301  BP: 118/68  Pulse: 88  SpO2: 99%      Review of Systems  Cardiovascular:  Positive for palpitations. Negative for chest pain, dyspnea on exertion, leg swelling and syncope.        Studies Reviewed: Christie Barron        EKG 05/25/2024: Normal sinus rhythm with sinus arrhythmia Normal ECG When compared with ECG of 17-Jul-2021 13:15, No significant change was found    Labs 05/2024: Hb 12.2 Cr 0.91   CT cardiac scoring 10/2022: Calcium score 0  Echocardiogram 2011: Normal echocardiogram    Physical Exam Vitals and nursing note reviewed.  Constitutional:      General: She is not in acute distress. Neck:     Vascular: No JVD.  Cardiovascular:     Rate and Rhythm: Normal rate and regular rhythm.     Heart sounds: Normal heart sounds. No murmur heard. Pulmonary:     Effort: Pulmonary effort is normal.     Breath sounds: Normal breath sounds. No wheezing or rales.  Musculoskeletal:     Right lower leg: No edema.     Left lower leg: No edema.      VISIT DIAGNOSES:   ICD-10-CM   1. Preop cardiovascular exam  Z01.810 EKG 12-Lead    2. Palpitations  R00.2 EKG 12-Lead    LONG TERM MONITOR (3-14 DAYS)    CANCELED: LONG TERM MONITOR (3-14 DAYS)    3. PVC (premature ventricular  contraction)  I49.3 ECHOCARDIOGRAM COMPLETE    LONG TERM MONITOR (3-14 DAYS)       Christie Barron is a 43 y.o. female with palpitations, family h/o early CAD Assessment & Plan  Preop risk stratification: Low cardiac risk for hysterectomy. I do not anticipate any change in her risk with upcoming echocardiogram and Zio monitor, which is apparently ordered due to her palpitation symptoms.  Palpitations: Prior history of PVC.  Recommend 2-week ZIO monitor to assess for any other arrhythmia. Will also obtain echocardiogram.  Family history of early CAD: Calcium score 0 in 2024.  LDL 90 recently.  Heart healthy diet and lifestyle   F/u in 1 year At that time, if no recurrent PVCs, patient can be seen as needed.  Signed, Newman JINNY Lawrence, MD

## 2024-05-28 ENCOUNTER — Ambulatory Visit (HOSPITAL_COMMUNITY)
Admission: RE | Admit: 2024-05-28 | Discharge: 2024-05-28 | Disposition: A | Discharge status: Home / Self Care | Source: Ambulatory Visit | Attending: Cardiology | Admitting: Cardiology

## 2024-05-28 DIAGNOSIS — I493 Ventricular premature depolarization: Secondary | ICD-10-CM | POA: Insufficient documentation

## 2024-05-28 LAB — ECHOCARDIOGRAM COMPLETE
AR max vel: 1.98 cm2
AV Area VTI: 2.08 cm2
AV Area mean vel: 2.02 cm2
AV Mean grad: 6 mmHg
AV Peak grad: 10.9 mmHg
Ao pk vel: 1.65 m/s
Area-P 1/2: 5.46 cm2
S' Lateral: 3.4 cm

## 2024-05-29 ENCOUNTER — Ambulatory Visit: Admitting: Physician Assistant

## 2024-05-30 ENCOUNTER — Ambulatory Visit: Payer: Self-pay | Admitting: Cardiology

## 2024-05-30 NOTE — Progress Notes (Signed)
 Normal pumping function of the heart. No severe heart valve abnormalities noted.  Monitor is pending, but based on this information, okay to proceed with upcoming hysterectomy with low cardiac risk.  Thanks MJP

## 2024-06-01 ENCOUNTER — Encounter: Payer: Self-pay | Admitting: *Deleted

## 2024-06-21 DIAGNOSIS — R002 Palpitations: Secondary | ICD-10-CM

## 2024-06-21 DIAGNOSIS — I493 Ventricular premature depolarization: Secondary | ICD-10-CM | POA: Diagnosis not present

## 2024-06-25 NOTE — Telephone Encounter (Signed)
 Left message to call back.

## 2024-06-26 NOTE — Telephone Encounter (Signed)
 Spoke with patient and she reports understanding of message from Dr. [place]. Pt would like to know what could be causing her to have V. Tach? She is curious if we need to increase any of her medications? Pt reports that she is already tired after taking the metoprolol.

## 2024-06-26 NOTE — Telephone Encounter (Signed)
 Patient returned RN's call.

## 2024-06-26 NOTE — Telephone Encounter (Signed)
 This was only 1 episode of 4 beat V. tach.  In the setting of structurally normal heart, and that she is already taking metoprolol, I do not think any additional medications are necessary at this time.  Thanks MJP

## 2024-08-02 ENCOUNTER — Ambulatory Visit (INDEPENDENT_AMBULATORY_CARE_PROVIDER_SITE_OTHER): Admitting: Obstetrics and Gynecology

## 2024-08-02 ENCOUNTER — Encounter: Payer: Self-pay | Admitting: Obstetrics and Gynecology

## 2024-08-02 VITALS — BP 122/76 | HR 88 | Temp 98.2°F | Ht 63.25 in | Wt 184.0 lb

## 2024-08-02 DIAGNOSIS — D252 Subserosal leiomyoma of uterus: Secondary | ICD-10-CM | POA: Diagnosis not present

## 2024-08-02 DIAGNOSIS — N1330 Unspecified hydronephrosis: Secondary | ICD-10-CM | POA: Diagnosis not present

## 2024-08-02 DIAGNOSIS — N939 Abnormal uterine and vaginal bleeding, unspecified: Secondary | ICD-10-CM

## 2024-08-02 NOTE — Assessment & Plan Note (Addendum)
 04/29/24 Reviewed CT with interval growth of known fibroid (2019), also with UPJ narrowing and AUB. CT urogram with BL mild hydronephrosis thought to be related to fibroid bulk 05/22/24 EMB benign  Patient notes interval growth of fibroid, this was also verified on exam today. Uterus now palpated just above the umbilicus. Patient is 5'3 with short torso. Given concerned for limited upper abdominal working, recommend proceed with TAH vs trial of GnRH agonist and reassessment for minimally invasive procedure.  She has elected to proceed with total abdominal hysterectomy and bilateral salpingectomy, cystoscopy. Will updated case posting to reflect this. Surgical scheduler aware. Discussed will need to update inpatient status to observation. Reviewed that recovery is usually 6 weeks with additional 6 weeks of pelvic rest. Risks including infections, bleeding, and damage to surrounding organs reviewed.  Patient is agreeable to blood transfusion in the event of an emergency.   Preop checklist: Meds and allergies reviewed. Antibiotics: Ancef, flagyl DVT ppx: SCDs, lovenox Postop visit: 2,6,12 week Additional clearance: s/p card clearance 05/25/24

## 2024-08-02 NOTE — Patient Instructions (Signed)
 Preoperative instructions: Nothing to eat or drink after midnight, unless instructed differently regarding clear liquids by the anesthesia team at [facility] health. Do not take any medications on the day of surgery, except those listed below: Metoprolol Synthroid (if taken in AM) Please follow all other instructions as provided by our surgical scheduler at [facility] and the anesthesia team at [facility] health.  Postoperative instructions: Clean your incision daily with mild soapy water.  Sitz bath are helpful for wound healing.   Ensure that you thoroughly dry your wound following cleaning and keep dry throughout the day.  You can apply a thin layer of Aquaphor or Vaseline over your incision. Ice packs can be applied to your incision for up to 20 minutes at a time to help with pain and swelling.  Take ibuprofen  as prescribed and over-the-counter Tylenol  as needed. (DO NOT TAKE IBUPROFEN  IF YOU HAVE CONTRAINDICATIONS THAT MAY INCLUDE USE OF BLOOD THINNERS, HISTORY STOMACH SURGERY OR GASTRITIS, CHRONIC KIDNEY DISEASE, ALLERGY, PRIOR INSTRUCTION TO AVOID IBUPROFEN -LIKE MEDICATIONS.)

## 2024-08-02 NOTE — H&P (View-Only) (Signed)
 43 y.o. G66P2002 female with CD x2, AUB, known fibroid, hypothyroidism, palpitations here for EMB. Married, vasectomy. Presents with her husband. Works hybrid schedule from [employer]. Former patient of Dr. Jertson.  Patient's last menstrual period was 07/05/2024.   She reports heavy, long, irregular periods. HVB on CD1-5, using flex cup, self emptying, +clots.  She can feel her fibroid. She is also noting bloating and early satiety. She has completed childbearing and desires hysterectomy. Notes left sided sciatica and left mid back pain.  She reports she feel like her fibroid has grown. She can feel it in her upper left belly now. Still feeling bloated. She is wondering if this would affect how the procedure is performed.  Has PVCs on metoprolol, managed by cards Jan 2024. No current CP or SOB. [x]  Cards clearance 05/30/24, normal echo [x]  05/25/24 zio monitor 2wk  04/29/24 CT a/p: IMPRESSION: 1. Mild right-sided hydronephrosis with abrupt narrowing at the UPJ. No obstructive ureteral calculi present. This may reflect changes of an underlying ureteral stricture and a follow-up CT urogram may be of benefit for further characterization. 2. Enlarged uterus containing a large, heterogeneously enhancing intramural or subserosal fibroid along the cranial aspect of the uterine body, measuring 9.7 x 9.6 x 14 cm.  Reviewed 12/29/2017 TVUS after hysteroscopy, D&C  which revealed a 4 cm degenerating subserosal fibroid.  05/22/24 path: A. ENDOMETRIUM, BIOPSY:  - Weakly proliferative endometrium.  - Negative for hyperplasia, intraepithelial neoplasia (EIN) and  malignancy.  - Benign endocervical fragments without identified dysplasia.   GYN HISTORY: CD x2  OB History  Gravida Para Term Preterm AB Living  2 2 2   2   SAB IAB Ectopic Multiple Live Births      2    # Outcome Date GA Lbr Len/2nd Weight Sex Type Anes PTL Lv  2 Term 02/2009 [redacted]w[redacted]d  7 lb 15 oz (3.6 kg) F CS-Unspec   LIV  1 Term 12/2006  [redacted]w[redacted]d  8 lb 11 oz (3.941 kg) M CS-Unspec   LIV   Past Medical History:  Diagnosis Date   Dysrhythmia    PVC'S   Environmental allergies    Flu    FINISHED TAMIFLU 2/22   Hypothyroidism    Migraine with aura    PVC (premature ventricular contraction)    PVC (premature ventricular contraction)    YEARS AGO   Past Surgical History:  Procedure Laterality Date   CESAREAN SECTION     x2   DILATATION & CURETTAGE/HYSTEROSCOPY WITH MYOSURE N/A 12/12/2017   Procedure: DILATATION & CURETTAGE/HYSTEROSCOPY;  Surgeon: Jannis Kate Norris, MD;  Location: [facility];  Service: Gynecology;  Laterality: N/A;   WISDOM TOOTH EXTRACTION  2013   Current Outpatient Medications on File Prior to Visit  Medication Sig Dispense Refill   Azelastine HCl 137 MCG/SPRAY SOLN SMARTSIG:1-2 Puff(s) Both Nares Twice Daily     Cholecalciferol (VITAMIN D3) 2000 units TABS Take 2,000 Units by mouth daily.     levocetirizine (XYZAL) 5 MG tablet Take 5 mg by mouth at bedtime.      levothyroxine (SYNTHROID, LEVOTHROID) 75 MCG tablet Take 75 mcg by mouth daily before breakfast.     Lysine 1000 MG TABS Take 1,000 mg by mouth daily.     metoprolol tartrate (LOPRESSOR) 25 MG tablet Take 12.5 mg by mouth 2 (two) times daily.      montelukast (SINGULAIR) 10 MG tablet Take 10 mg by mouth at bedtime.     Multiple Vitamin (MULTIVITAMIN) tablet Take 1 tablet  by mouth daily.     topiramate (TOPAMAX) 100 MG tablet Take 100 mg by mouth 2 (two) times daily.      valACYclovir (VALTREX) 1000 MG tablet TAKE 2 TABLETS BY MOUTH EVERY 12 HOURS AS NEEDED COLD SORES.  2   No current facility-administered medications on file prior to visit.   Allergies  Allergen Reactions   Zithromax [Azithromycin] Other (See Comments)      PE Today's Vitals   08/02/24 1559  BP: 122/76  Pulse: 88  Temp: 98.2 F (36.8 C)  TempSrc: Oral  SpO2: 98%  Weight: 184 lb (83.5 kg)  Height: 5' 3.25 (1.607 m)    Body mass index is 32.34  kg/m.  Physical Exam Vitals reviewed. Exam conducted with a chaperone present.  Constitutional:      General: She is not in acute distress.    Appearance: Normal appearance.  HENT:     Head: Normocephalic and atraumatic.     Nose: Nose normal.  Eyes:     Extraocular Movements: Extraocular movements intact.     Conjunctiva/sclera: Conjunctivae normal.  Cardiovascular:     Rate and Rhythm: Normal rate and regular rhythm.     Heart sounds: No murmur heard.    No friction rub. No gallop.  Pulmonary:     Effort: Pulmonary effort is normal. No respiratory distress.     Breath sounds: Normal breath sounds. No stridor. No wheezing, rhonchi or rales.  Abdominal:     Palpations: There is mass.     Comments: Fibroid palpated ~1 finger breadth above umbilicus  Genitourinary:    General: Normal vulva.     Exam position: Lithotomy position.     Vagina: Normal. No vaginal discharge.     Cervix: Normal. No cervical motion tenderness, discharge or lesion.     Uterus: Normal. Enlarged. Not tender.      Adnexa: Right adnexa normal and left adnexa normal.     Comments: Uterus and fibroid mobile, good posterior space Musculoskeletal:        General: Normal range of motion.     Cervical back: Normal range of motion.  Neurological:     General: No focal deficit present.     Mental Status: She is alert.  Psychiatric:        Mood and Affect: Mood normal.        Behavior: Behavior normal.       Assessment and Plan:        Abnormal uterine bleeding (AUB)  Subserous leiomyoma of uterus Assessment & Plan: 04/29/24 Reviewed CT with interval growth of known fibroid (2019), also with UPJ narrowing and AUB. CT urogram with BL mild hydronephrosis thought to be related to fibroid bulk 05/22/24 EMB benign  Patient notes interval growth of fibroid, this was also verified on exam today. Uterus now palpated just above the umbilicus. Patient is 5'3 with short torso. Given concerned for limited upper  abdominal working, recommend proceed with TAH vs trial of GnRH agonist and reassessment for minimally invasive procedure.  She has elected to proceed with total abdominal hysterectomy and bilateral salpingectomy, cystoscopy. Will updated case posting to reflect this. Surgical scheduler aware. Discussed will need to update inpatient status to observation. Reviewed that recovery is usually 6 weeks with additional 6 weeks of pelvic rest. Risks including infections, bleeding, and damage to surrounding organs reviewed.  Patient is agreeable to blood transfusion in the event of an emergency.   Preop checklist: Meds and allergies reviewed. Antibiotics: Ancef, flagyl  DVT ppx: SCDs, lovenox Postop visit: 2,6,12 week Additional clearance: s/p card clearance 05/25/24     Hydronephrosis, unspecified hydronephrosis type  Normal kidney function on 05/22/24 BMP Follow-up with PCP postop   Vera LULLA Pa, MD

## 2024-08-02 NOTE — Progress Notes (Signed)
 43 y.o. G66P2002 female with CD x2, AUB, known fibroid, hypothyroidism, palpitations here for EMB. Married, vasectomy. Presents with her husband. Works hybrid schedule from [employer]. Former patient of Dr. Jertson.  Patient's last menstrual period was 07/05/2024.   She reports heavy, long, irregular periods. HVB on CD1-5, using flex cup, self emptying, +clots.  She can feel her fibroid. She is also noting bloating and early satiety. She has completed childbearing and desires hysterectomy. Notes left sided sciatica and left mid back pain.  She reports she feel like her fibroid has grown. She can feel it in her upper left belly now. Still feeling bloated. She is wondering if this would affect how the procedure is performed.  Has PVCs on metoprolol, managed by cards Jan 2024. No current CP or SOB. [x]  Cards clearance 05/30/24, normal echo [x]  05/25/24 zio monitor 2wk  04/29/24 CT a/p: IMPRESSION: 1. Mild right-sided hydronephrosis with abrupt narrowing at the UPJ. No obstructive ureteral calculi present. This may reflect changes of an underlying ureteral stricture and a follow-up CT urogram may be of benefit for further characterization. 2. Enlarged uterus containing a large, heterogeneously enhancing intramural or subserosal fibroid along the cranial aspect of the uterine body, measuring 9.7 x 9.6 x 14 cm.  Reviewed 12/29/2017 TVUS after hysteroscopy, D&C  which revealed a 4 cm degenerating subserosal fibroid.  05/22/24 path: A. ENDOMETRIUM, BIOPSY:  - Weakly proliferative endometrium.  - Negative for hyperplasia, intraepithelial neoplasia (EIN) and  malignancy.  - Benign endocervical fragments without identified dysplasia.   GYN HISTORY: CD x2  OB History  Gravida Para Term Preterm AB Living  2 2 2   2   SAB IAB Ectopic Multiple Live Births      2    # Outcome Date GA Lbr Len/2nd Weight Sex Type Anes PTL Lv  2 Term 02/2009 [redacted]w[redacted]d  7 lb 15 oz (3.6 kg) F CS-Unspec   LIV  1 Term 12/2006  [redacted]w[redacted]d  8 lb 11 oz (3.941 kg) M CS-Unspec   LIV   Past Medical History:  Diagnosis Date   Dysrhythmia    PVC'S   Environmental allergies    Flu    FINISHED TAMIFLU 2/22   Hypothyroidism    Migraine with aura    PVC (premature ventricular contraction)    PVC (premature ventricular contraction)    YEARS AGO   Past Surgical History:  Procedure Laterality Date   CESAREAN SECTION     x2   DILATATION & CURETTAGE/HYSTEROSCOPY WITH MYOSURE N/A 12/12/2017   Procedure: DILATATION & CURETTAGE/HYSTEROSCOPY;  Surgeon: Jannis Kate Norris, MD;  Location: [facility];  Service: Gynecology;  Laterality: N/A;   WISDOM TOOTH EXTRACTION  2013   Current Outpatient Medications on File Prior to Visit  Medication Sig Dispense Refill   Azelastine HCl 137 MCG/SPRAY SOLN SMARTSIG:1-2 Puff(s) Both Nares Twice Daily     Cholecalciferol (VITAMIN D3) 2000 units TABS Take 2,000 Units by mouth daily.     levocetirizine (XYZAL) 5 MG tablet Take 5 mg by mouth at bedtime.      levothyroxine (SYNTHROID, LEVOTHROID) 75 MCG tablet Take 75 mcg by mouth daily before breakfast.     Lysine 1000 MG TABS Take 1,000 mg by mouth daily.     metoprolol tartrate (LOPRESSOR) 25 MG tablet Take 12.5 mg by mouth 2 (two) times daily.      montelukast (SINGULAIR) 10 MG tablet Take 10 mg by mouth at bedtime.     Multiple Vitamin (MULTIVITAMIN) tablet Take 1 tablet  by mouth daily.     topiramate (TOPAMAX) 100 MG tablet Take 100 mg by mouth 2 (two) times daily.      valACYclovir (VALTREX) 1000 MG tablet TAKE 2 TABLETS BY MOUTH EVERY 12 HOURS AS NEEDED COLD SORES.  2   No current facility-administered medications on file prior to visit.   Allergies  Allergen Reactions   Zithromax [Azithromycin] Other (See Comments)      PE Today's Vitals   08/02/24 1559  BP: 122/76  Pulse: 88  Temp: 98.2 F (36.8 C)  TempSrc: Oral  SpO2: 98%  Weight: 184 lb (83.5 kg)  Height: 5' 3.25 (1.607 m)    Body mass index is 32.34  kg/m.  Physical Exam Vitals reviewed. Exam conducted with a chaperone present.  Constitutional:      General: She is not in acute distress.    Appearance: Normal appearance.  HENT:     Head: Normocephalic and atraumatic.     Nose: Nose normal.  Eyes:     Extraocular Movements: Extraocular movements intact.     Conjunctiva/sclera: Conjunctivae normal.  Cardiovascular:     Rate and Rhythm: Normal rate and regular rhythm.     Heart sounds: No murmur heard.    No friction rub. No gallop.  Pulmonary:     Effort: Pulmonary effort is normal. No respiratory distress.     Breath sounds: Normal breath sounds. No stridor. No wheezing, rhonchi or rales.  Abdominal:     Palpations: There is mass.     Comments: Fibroid palpated ~1 finger breadth above umbilicus  Genitourinary:    General: Normal vulva.     Exam position: Lithotomy position.     Vagina: Normal. No vaginal discharge.     Cervix: Normal. No cervical motion tenderness, discharge or lesion.     Uterus: Normal. Enlarged. Not tender.      Adnexa: Right adnexa normal and left adnexa normal.     Comments: Uterus and fibroid mobile, good posterior space Musculoskeletal:        General: Normal range of motion.     Cervical back: Normal range of motion.  Neurological:     General: No focal deficit present.     Mental Status: She is alert.  Psychiatric:        Mood and Affect: Mood normal.        Behavior: Behavior normal.       Assessment and Plan:        Abnormal uterine bleeding (AUB)  Subserous leiomyoma of uterus Assessment & Plan: 04/29/24 Reviewed CT with interval growth of known fibroid (2019), also with UPJ narrowing and AUB. CT urogram with BL mild hydronephrosis thought to be related to fibroid bulk 05/22/24 EMB benign  Patient notes interval growth of fibroid, this was also verified on exam today. Uterus now palpated just above the umbilicus. Patient is 5'3 with short torso. Given concerned for limited upper  abdominal working, recommend proceed with TAH vs trial of GnRH agonist and reassessment for minimally invasive procedure.  She has elected to proceed with total abdominal hysterectomy and bilateral salpingectomy, cystoscopy. Will updated case posting to reflect this. Surgical scheduler aware. Discussed will need to update inpatient status to observation. Reviewed that recovery is usually 6 weeks with additional 6 weeks of pelvic rest. Risks including infections, bleeding, and damage to surrounding organs reviewed.  Patient is agreeable to blood transfusion in the event of an emergency.   Preop checklist: Meds and allergies reviewed. Antibiotics: Ancef, flagyl  DVT ppx: SCDs, lovenox Postop visit: 2,6,12 week Additional clearance: s/p card clearance 05/25/24     Hydronephrosis, unspecified hydronephrosis type  Normal kidney function on 05/22/24 BMP Follow-up with PCP postop   Vera LULLA Pa, MD

## 2024-08-07 ENCOUNTER — Encounter: Payer: Self-pay | Admitting: *Deleted

## 2024-08-07 DIAGNOSIS — Z0289 Encounter for other administrative examinations: Secondary | ICD-10-CM

## 2024-08-09 NOTE — Telephone Encounter (Signed)
 Call returned to patient. Patient was advised by PCP to start OTC iron daily due to low iron levels. Patient asking if ok to start prior to surgery? Has concerns about constipation.   Last Hgb 12.2 on 05/22/24 No labs from PCP in EPIC  Patient states labs with PCP approximately 2 weeks ago, states she is uncertain if iron studies were included in these labs. I asked patient is she could send a copy of labs to the office, she would send via MyChart.   Advised I will review with Dr. Dallie and f/u. Patient agreeable.   Routing to Dr. Dallie

## 2024-08-14 ENCOUNTER — Encounter (HOSPITAL_COMMUNITY): Payer: Self-pay | Admitting: Obstetrics and Gynecology

## 2024-08-14 NOTE — Progress Notes (Signed)
 Spoke w/ via phone for pre-op interview--- pt Lab needs dos----   upt (per anes)      Lab results------ lab appt 08-20-2024 @ 0900 getting CBC/ T&S (per anes) COVID test -----patient states asymptomatic no test needed Arrive at ------- 0630 on 08-22-2024 NPO after MN w/ exception sips of water w/ meds Pre-Surgery Ensure or G2: n/a  Med rec completed Medications to take morning of surgery ----- topamax, synthroid, toprol, azelastive spray Diabetic medication ----- n/a  GLP1 agonist last dose: n/a GLP1 instructions:  Patient instructed no nail polish to be worn day of surgery Patient instructed to bring photo id and insurance card day of surgery Patient aware to have Driver (ride ) / caregiver    for 24 hours after surgery - husband, Christie Barron Patient Special Instructions ----- will pick up soap and written instructions at lab appt Pre-Op special Instructions ----- pt has cardiac clearance from Dr Lawence dated 05-25-2024 in epic/ chart  Patient verbalized understanding of instructions that were given at this phone interview. Patient denies chest pain, sob, fever, cough at the interview.

## 2024-08-14 NOTE — Pre-Procedure Instructions (Signed)
 Surgical Instructions  Your procedure is scheduled on :   Wednesday,  08-22-2024 Report to [facility] Entrance A at 6:30 AM, then check in the Admitting office. Any questions or running late day of surgery :  call [phone]  Questions prior to your surgery day:  call [phone], Monday -- Friday 8am - 4pm. If you experience any cold or flu symptoms such as cough, fever, chills, shortness of breath, etc. between now and you scheduled surgery, please notify your surgeon office.   Remember: Do Not eat any food and Do Not drink any liquids after midnight the night before surgery.   This includes No water,  candy,  gum, and mints.  Take these medicines the morning of surgery with A SIPS OF WATER:   Topiramate (topamax) Metoprolol (toprol) Azelastine nasal spray Levothyroxine (synthroid  May take these medicines IF NEEDED:  NONE   One week prior to surgery, STOP taking any Aspirin (unless otherwise instructed by your surgeon) Aleve, Naproxen, ibuprofen , Motrin , Advil , Goody's, BC's, all herbal medications/ supplements, fish oil, and non-prescription vitamins.  Do NOT Smoke (tobacco/ vaping) and Do Not drink alcohol for 24 hours prior to your procedure.  For those patients that use a CPAP.  Please bring your CPAP/ mask/ tubing with them day of surgery . Anesthesia may ask recovery room nurse to use and if you stay the night you be asked to use it.  You will be asked to removed any contacts, glasses, piercing's, hearing aid's, dentures/ partials prior to surgery.  Please bring cases/ container/ solution/ etc., for them day of surgery.   Patients discharged the day of surgery will NOT be allowed to drive home.  You must have responsible driver and caregiver to stay at home with you the next 24 hours.  SURGICAL WAITING ROOM VISITATION Patients may have no more than 2 support people in the waiting area - if more than 2 , these visitors may rotate.  Pre-op nurse will coordinate an  appropriate time for 1 Adult support person, who may not rotate, to accompany patient in pre-op.  Aware some patients may have certain circumstances, speak to pre-op nurse day of surgery.  Children under the age 8 must have an adult with them who is not the patient and must remain in the main waiting area with an adult.  If the patient needs to stay at the hospital during part of their recovery, the visitor guidelines for inpatient rooms apply.  Please refer to the [facility] website for the visitor guidelines for any additional information.  If you received a COVID test during your pre-op visit it is requested that you wear a mask when out in public, stay away from anyone that may not be feeling well and notify your surgeon if you develop symptoms.  If you have been in contact with anyone that has tested positive in the past 10 days notify your surgeon.     [facility] - Preparing for Surgery  Before surgery, you can play an important role. Because skin is not sterile, it needs to be as free of germs as possible. You can reduce the number of germs on your skin by washing with CHG (chlorhexidine  gluconate) soap before surgery. CHG is an antiseptic cleaner which kills germs and bonds with the skin to continue killing germs even after washing. Oral hygiene is also important in reducing the risk of infection. Remember to brush your teeth with your regular toothpaste the morning of surgery.  Please DO  NOT use if you have an allergy to CHG or antibacterial soaps. If your skin becomes reddened/irritated stop using the CHG and inform your Pre-op nurse day of surgery.  DO NOT shave (including legs and genital area) for at least 48 hours prior to your CHG shower.   Please follow these instructions carefully:  Shower with CHG soap the night before surgery. If you choose to wash your hair, wash your hair first as usual with your normal shampoo. After you shampoo, rinse your hair and body thoroughly to  remove the shampoo. Use CHG as you would any other liquid soap. You can apply CHG directly to the skin and wash gently with a clean washcloth or shower sponge. Apply the CHG soap to your body ONLY FROM THE NECK DOWN. Do not use on open wounds or open sores. Avoid contact with your eyes, ears, mouth, and genitals (private parts). Wash genitals (private parts) with your normal soap. Wash thoroughly, paying special attention to the area where your surgery will be performed. Thoroughly rinse your body with warm water from the neck down. DO NOT shower/wash with your normal soap after using and rinsing off the CHG soap. DO NOT use lotions, oils, etc., after showering with CHG. Pat yourself dry with a clean towel. Wear clean pajamas. Place clean sheets on your bed the night of your CHG shower and do not sleep with pets.  Day of Surgery  DO NOT Apply any lotions,  powder,  oils,  deodorants (may use underarm deodorant),  cologne/  perfumes  or makeup Do Not wear jewelry /  piercing's/  metal/  permanent jewelry must be removed prior to arrival day of surgery. (No plastic piercing) Do Not wear nail polish,  gel polish,  artificial nails, or any other type of covering on natural finger nails (toe nails are okay) Remember to brush your teeth and rinse mouth out. Put on clean / comfortable clothes. [facility] is not responsible for valuables/ personal belongings

## 2024-08-15 NOTE — Telephone Encounter (Signed)
 Call returned to patient.  Patient states she was advised by pre-op RN at [facility], ok to continue L Lysine for cold sore prevention. Advised ok to continue If advised by pre-op RN.  Patient states spouse and son are experiencing runny nose. Patient denies any symptoms. Denies sore throat, fever, chills, SOB, cough. Advised patient to continue to monitor, contact the office as soon as possible if any symptoms develop. Questions answered.   Advised Dr. Dallie is out of the office this week, will also send update to her for review when she returns. I will also send to provider in office to review, I will f/u if any concerns or additional recommendations. Patient agreeable.   Routing to Dr. Glennon to review.   Cc: Dr. Dallie

## 2024-08-20 ENCOUNTER — Encounter (HOSPITAL_COMMUNITY)
Admission: RE | Admit: 2024-08-20 | Discharge: 2024-08-20 | Disposition: A | Discharge status: Home / Self Care | Source: Ambulatory Visit | Attending: Obstetrics and Gynecology | Admitting: Obstetrics and Gynecology

## 2024-08-20 DIAGNOSIS — Z01812 Encounter for preprocedural laboratory examination: Secondary | ICD-10-CM | POA: Insufficient documentation

## 2024-08-20 DIAGNOSIS — Z01818 Encounter for other preprocedural examination: Secondary | ICD-10-CM

## 2024-08-20 LAB — TYPE AND SCREEN
ABO/RH(D): A NEG
Antibody Screen: NEGATIVE

## 2024-08-20 LAB — CBC
HCT: 40.2 % (ref 36.0–46.0)
Hemoglobin: 12.9 g/dL (ref 12.0–15.0)
MCH: 28.3 pg (ref 26.0–34.0)
MCHC: 32.1 g/dL (ref 30.0–36.0)
MCV: 88.2 fL (ref 80.0–100.0)
Platelets: 341 K/uL (ref 150–400)
RBC: 4.56 MIL/uL (ref 3.87–5.11)
RDW: 14.3 % (ref 11.5–15.5)
WBC: 5.8 K/uL (ref 4.0–10.5)
nRBC: 0 % (ref 0.0–0.2)

## 2024-08-21 NOTE — Telephone Encounter (Signed)
 Patient notified and is agreeable.   Encounter closed.

## 2024-08-21 NOTE — Telephone Encounter (Signed)
 Patient left message on surgery line asking if ok to use enema or take oral laxative tonight? States she wants to try to avoid constipation after surgery. Surgery scheduled for 08/22/24.   Dr. Dallie -please advise.

## 2024-08-22 ENCOUNTER — Observation Stay (HOSPITAL_COMMUNITY): Payer: Self-pay | Admitting: Anesthesiology

## 2024-08-22 ENCOUNTER — Observation Stay (HOSPITAL_COMMUNITY)
Admission: RE | Admit: 2024-08-22 | Discharge: 2024-08-23 | Disposition: A | Discharge status: Home / Self Care | Attending: Obstetrics and Gynecology | Admitting: Obstetrics and Gynecology

## 2024-08-22 ENCOUNTER — Encounter (HOSPITAL_COMMUNITY): Admission: RE | Disposition: A | Payer: Self-pay | Source: Home / Self Care | Attending: Obstetrics and Gynecology

## 2024-08-22 ENCOUNTER — Encounter (HOSPITAL_COMMUNITY): Payer: Self-pay | Admitting: Obstetrics and Gynecology

## 2024-08-22 ENCOUNTER — Other Ambulatory Visit: Payer: Self-pay

## 2024-08-22 ENCOUNTER — Ambulatory Visit (HOSPITAL_COMMUNITY): Payer: Self-pay | Admitting: Anesthesiology

## 2024-08-22 DIAGNOSIS — D252 Subserosal leiomyoma of uterus: Secondary | ICD-10-CM

## 2024-08-22 DIAGNOSIS — E039 Hypothyroidism, unspecified: Secondary | ICD-10-CM | POA: Diagnosis not present

## 2024-08-22 DIAGNOSIS — N939 Abnormal uterine and vaginal bleeding, unspecified: Secondary | ICD-10-CM | POA: Diagnosis present

## 2024-08-22 DIAGNOSIS — D259 Leiomyoma of uterus, unspecified: Secondary | ICD-10-CM | POA: Diagnosis present

## 2024-08-22 DIAGNOSIS — Z01818 Encounter for other preprocedural examination: Principal | ICD-10-CM

## 2024-08-22 HISTORY — DX: Unspecified hydronephrosis: N13.30

## 2024-08-22 HISTORY — DX: Abnormal uterine and vaginal bleeding, unspecified: N93.9

## 2024-08-22 HISTORY — PX: HYSTERECTOMY ABDOMINAL WITH SALPINGECTOMY: SHX6725

## 2024-08-22 HISTORY — DX: Iron deficiency anemia, unspecified: D50.9

## 2024-08-22 HISTORY — DX: Palpitations: R00.2

## 2024-08-22 HISTORY — DX: Mixed hyperlipidemia: E78.2

## 2024-08-22 HISTORY — DX: Presence of spectacles and contact lenses: Z97.3

## 2024-08-22 HISTORY — DX: Frequency of micturition: R35.0

## 2024-08-22 HISTORY — DX: Gastro-esophageal reflux disease without esophagitis: K21.9

## 2024-08-22 HISTORY — DX: Constipation, unspecified: K59.00

## 2024-08-22 HISTORY — PX: CYSTOSCOPY: SHX5120

## 2024-08-22 HISTORY — DX: Allergic rhinitis, unspecified: J30.9

## 2024-08-22 HISTORY — DX: Leiomyoma of uterus, unspecified: D25.9

## 2024-08-22 LAB — CBC
HCT: 30.1 % — ABNORMAL LOW (ref 36.0–46.0)
Hemoglobin: 9.6 g/dL — ABNORMAL LOW (ref 12.0–15.0)
MCH: 28.2 pg (ref 26.0–34.0)
MCHC: 31.9 g/dL (ref 30.0–36.0)
MCV: 88.5 fL (ref 80.0–100.0)
Platelets: 213 K/uL (ref 150–400)
RBC: 3.4 MIL/uL — ABNORMAL LOW (ref 3.87–5.11)
RDW: 14.6 % (ref 11.5–15.5)
WBC: 10.7 K/uL — ABNORMAL HIGH (ref 4.0–10.5)
nRBC: 0 % (ref 0.0–0.2)

## 2024-08-22 LAB — BASIC METABOLIC PANEL WITH GFR
Anion gap: 14 (ref 5–15)
BUN: 7 mg/dL (ref 6–20)
CO2: 21 mmol/L — ABNORMAL LOW (ref 22–32)
Calcium: 8.9 mg/dL (ref 8.9–10.3)
Chloride: 103 mmol/L (ref 98–111)
Creatinine, Ser: 0.83 mg/dL (ref 0.44–1.00)
GFR, Estimated: >60 mL/min (ref >60)
Glucose, Bld: 96 mg/dL (ref 70–99)
Potassium: 3.2 mmol/L — ABNORMAL LOW (ref 3.5–5.1)
Sodium: 138 mmol/L (ref 135–145)

## 2024-08-22 LAB — POCT PREGNANCY, URINE: Preg Test, Ur: NEGATIVE

## 2024-08-22 SURGERY — HYSTERECTOMY, TOTAL, ABDOMINAL, WITH SALPINGECTOMY
Anesthesia: General | Site: Bladder | Laterality: Bilateral

## 2024-08-22 MED ORDER — ACETAMINOPHEN 160 MG/5ML PO SOLN
1000.0000 mg | Freq: Three times a day (TID) | ORAL | Status: DC
  Administered 2024-08-22 – 2024-08-23 (×4): 1000 mg via ORAL
  Filled 2024-08-22 (×4): qty 40.6

## 2024-08-22 MED ORDER — METRONIDAZOLE 500 MG/100ML IV SOLN
500.0000 mg | Freq: Once | INTRAVENOUS | Status: AC
  Administered 2024-08-22: 500 mg via INTRAVENOUS

## 2024-08-22 MED ORDER — SODIUM CHLORIDE (PF) 0.9 % IJ SOLN
PREFILLED_SYRINGE | INTRAVENOUS | Status: DC | PRN
  Administered 2024-08-22: 200 mL via INTRAVESICAL

## 2024-08-22 MED ORDER — CEFAZOLIN SODIUM-DEXTROSE 2-4 GM/100ML-% IV SOLN
2.0000 g | Freq: Three times a day (TID) | INTRAVENOUS | Status: AC
  Administered 2024-08-22: 2 g via INTRAVENOUS
  Filled 2024-08-22 (×3): qty 100

## 2024-08-22 MED ORDER — METOPROLOL TARTRATE 12.5 MG HALF TABLET
12.5000 mg | ORAL_TABLET | Freq: Two times a day (BID) | ORAL | Status: DC
  Administered 2024-08-23: 12.5 mg via ORAL
  Filled 2024-08-22 (×3): qty 1

## 2024-08-22 MED ORDER — CELECOXIB 200 MG PO CAPS
ORAL_CAPSULE | ORAL | Status: AC
  Filled 2024-08-22: qty 2

## 2024-08-22 MED ORDER — PROPOFOL 10 MG/ML IV BOLUS
INTRAVENOUS | Status: AC
Start: 2024-08-22 — End: 2024-08-22
  Filled 2024-08-22: qty 20

## 2024-08-22 MED ORDER — 0.9 % SODIUM CHLORIDE (POUR BTL) OPTIME
TOPICAL | Status: DC | PRN
  Administered 2024-08-22: 2000 mL

## 2024-08-22 MED ORDER — MEPERIDINE HCL 25 MG/ML IJ SOLN
INTRAMUSCULAR | Status: AC
  Filled 2024-08-22: qty 1

## 2024-08-22 MED ORDER — GABAPENTIN 300 MG PO CAPS: 300.0000 mg | ORAL_CAPSULE | ORAL | Status: DC

## 2024-08-22 MED ORDER — CEFAZOLIN SODIUM-DEXTROSE 2-4 GM/100ML-% IV SOLN
2.0000 g | INTRAVENOUS | Status: AC
  Administered 2024-08-22 (×2): 2 g via INTRAVENOUS

## 2024-08-22 MED ORDER — LIDOCAINE 2% (20 MG/ML) 5 ML SYRINGE
INTRAMUSCULAR | Status: DC | PRN
  Administered 2024-08-22: 80 mg via INTRAVENOUS

## 2024-08-22 MED ORDER — FENTANYL CITRATE (PF) 250 MCG/5ML IJ SOLN
INTRAMUSCULAR | Status: AC
  Filled 2024-08-22: qty 5

## 2024-08-22 MED ORDER — WHITE PETROLATUM EX OINT
TOPICAL_OINTMENT | CUTANEOUS | Status: AC
  Filled 2024-08-22: qty 28.35

## 2024-08-22 MED ORDER — METRONIDAZOLE 500 MG/100ML IV SOLN
500.0000 mg | Freq: Three times a day (TID) | INTRAVENOUS | Status: AC
  Administered 2024-08-22: 500 mg via INTRAVENOUS
  Filled 2024-08-22: qty 100

## 2024-08-22 MED ORDER — AMISULPRIDE (ANTIEMETIC) 5 MG/2ML IV SOLN
10.0000 mg | Freq: Once | INTRAVENOUS | Status: DC | PRN
Start: 2024-08-22 — End: 2024-08-22

## 2024-08-22 MED ORDER — BUPIVACAINE HCL (PF) 0.5 % IJ SOLN
INTRAMUSCULAR | Status: AC
  Filled 2024-08-22: qty 30

## 2024-08-22 MED ORDER — OXYCODONE HCL 5 MG PO TABS: 5.0000 mg | ORAL_TABLET | Freq: Once | ORAL | Status: DC | PRN

## 2024-08-22 MED ORDER — BUPIVACAINE HCL (PF) 0.25 % IJ SOLN
INTRAMUSCULAR | Status: AC
  Filled 2024-08-22: qty 30

## 2024-08-22 MED ORDER — GABAPENTIN 300 MG PO CAPS
ORAL_CAPSULE | ORAL | Status: AC
  Filled 2024-08-22: qty 1

## 2024-08-22 MED ORDER — VASOPRESSIN 20 UNIT/ML IV SOLN
INTRAVENOUS | Status: DC | PRN
  Administered 2024-08-22: 10 mL

## 2024-08-22 MED ORDER — ACETAMINOPHEN 500 MG PO TABS
1000.0000 mg | ORAL_TABLET | ORAL | Status: AC
  Administered 2024-08-22: 1000 mg via ORAL

## 2024-08-22 MED ORDER — SODIUM CHLORIDE (PF) 0.9 % IJ SOLN
INTRAMUSCULAR | Status: AC
  Filled 2024-08-22: qty 100

## 2024-08-22 MED ORDER — CEFAZOLIN SODIUM-DEXTROSE 2-4 GM/100ML-% IV SOLN
INTRAVENOUS | Status: AC
  Filled 2024-08-22: qty 100

## 2024-08-22 MED ORDER — ALBUMIN HUMAN 5 % IV SOLN: INTRAVENOUS | Status: DC | PRN

## 2024-08-22 MED ORDER — ONDANSETRON HCL 4 MG/2ML IJ SOLN
4.0000 mg | Freq: Four times a day (QID) | INTRAMUSCULAR | Status: DC | PRN
  Administered 2024-08-22 (×2): 4 mg via INTRAVENOUS
  Filled 2024-08-22 (×2): qty 2

## 2024-08-22 MED ORDER — LACTATED RINGERS IV SOLN: INTRAVENOUS | Status: DC

## 2024-08-22 MED ORDER — ENOXAPARIN SODIUM 40 MG/0.4ML IJ SOSY
PREFILLED_SYRINGE | INTRAMUSCULAR | Status: AC
  Filled 2024-08-22: qty 0.4

## 2024-08-22 MED ORDER — BUPIVACAINE LIPOSOME 1.3 % IJ SUSP
INTRAMUSCULAR | Status: AC
  Filled 2024-08-22: qty 20

## 2024-08-22 MED ORDER — ONDANSETRON HCL 4 MG/2ML IJ SOLN
INTRAMUSCULAR | Status: DC | PRN
  Administered 2024-08-22: 4 mg via INTRAVENOUS

## 2024-08-22 MED ORDER — PANTOPRAZOLE SODIUM 40 MG IV SOLR
40.0000 mg | Freq: Every day | INTRAVENOUS | Status: DC
  Administered 2024-08-22: 40 mg via INTRAVENOUS
  Filled 2024-08-22: qty 10

## 2024-08-22 MED ORDER — HYDROMORPHONE HCL 1 MG/ML IJ SOLN
0.2500 mg | INTRAMUSCULAR | Status: DC | PRN
  Administered 2024-08-22 (×2): 0.5 mg via INTRAVENOUS

## 2024-08-22 MED ORDER — SODIUM CHLORIDE 0.9 % IR SOLN
Status: DC | PRN
  Administered 2024-08-22 (×2): 1000 mL via INTRAVESICAL

## 2024-08-22 MED ORDER — FENTANYL CITRATE (PF) 250 MCG/5ML IJ SOLN
INTRAMUSCULAR | Status: DC | PRN
  Administered 2024-08-22: 100 ug via INTRAVENOUS
  Administered 2024-08-22 (×3): 50 ug via INTRAVENOUS

## 2024-08-22 MED ORDER — ENOXAPARIN SODIUM 40 MG/0.4ML IJ SOSY
40.0000 mg | PREFILLED_SYRINGE | INTRAMUSCULAR | Status: AC
  Administered 2024-08-22: 40 mg via SUBCUTANEOUS

## 2024-08-22 MED ORDER — POLYETHYLENE GLYCOL 3350 17 G PO PACK
17.0000 g | PACK | Freq: Every day | ORAL | Status: DC
  Administered 2024-08-22: 17 g via ORAL
  Filled 2024-08-22: qty 1

## 2024-08-22 MED ORDER — MEPERIDINE HCL 25 MG/ML IJ SOLN
6.2500 mg | INTRAMUSCULAR | Status: DC | PRN
  Administered 2024-08-22 (×2): 6.25 mg via INTRAVENOUS

## 2024-08-22 MED ORDER — CELECOXIB 200 MG PO CAPS: 400.0000 mg | ORAL_CAPSULE | ORAL | Status: DC

## 2024-08-22 MED ORDER — HEMOSTATIC AGENTS (NO CHARGE) OPTIME
TOPICAL | Status: DC | PRN
  Administered 2024-08-22: 1

## 2024-08-22 MED ORDER — CHLORHEXIDINE GLUCONATE 0.12 % MT SOLN
15.0000 mL | Freq: Once | OROMUCOSAL | Status: AC
  Administered 2024-08-22: 15 mL via OROMUCOSAL

## 2024-08-22 MED ORDER — KETOROLAC TROMETHAMINE 30 MG/ML IJ SOLN
INTRAMUSCULAR | Status: DC | PRN
  Administered 2024-08-22: 30 mg via INTRAVENOUS

## 2024-08-22 MED ORDER — KETOROLAC TROMETHAMINE 30 MG/ML IJ SOLN
30.0000 mg | Freq: Four times a day (QID) | INTRAMUSCULAR | Status: AC
  Administered 2024-08-22 – 2024-08-23 (×4): 30 mg via INTRAVENOUS
  Filled 2024-08-22 (×4): qty 1

## 2024-08-22 MED ORDER — LACTATED RINGERS IV SOLN: INTRAVENOUS | Status: DC | PRN

## 2024-08-22 MED ORDER — ORAL CARE MOUTH RINSE: 15.0000 mL | Freq: Once | OROMUCOSAL | Status: AC

## 2024-08-22 MED ORDER — HYDROMORPHONE HCL 1 MG/ML IJ SOLN
INTRAMUSCULAR | Status: AC
  Filled 2024-08-22: qty 1

## 2024-08-22 MED ORDER — SUGAMMADEX SODIUM 200 MG/2ML IV SOLN
INTRAVENOUS | Status: DC | PRN
Start: 2024-08-22 — End: 2024-08-22
  Administered 2024-08-22: 200 mg via INTRAVENOUS

## 2024-08-22 MED ORDER — ACETAMINOPHEN 500 MG PO TABS
ORAL_TABLET | ORAL | Status: AC
  Filled 2024-08-22: qty 2

## 2024-08-22 MED ORDER — IBUPROFEN 600 MG PO TABS: 600.0000 mg | ORAL_TABLET | Freq: Four times a day (QID) | ORAL | Status: DC

## 2024-08-22 MED ORDER — CHLORHEXIDINE GLUCONATE 0.12 % MT SOLN
OROMUCOSAL | Status: AC
  Filled 2024-08-22: qty 15

## 2024-08-22 MED ORDER — MIDAZOLAM HCL (PF) 2 MG/2ML IJ SOLN
INTRAMUSCULAR | Status: DC | PRN
  Administered 2024-08-22: 2 mg via INTRAVENOUS

## 2024-08-22 MED ORDER — ONDANSETRON HCL 4 MG PO TABS: 4.0000 mg | ORAL_TABLET | Freq: Four times a day (QID) | ORAL | Status: DC | PRN

## 2024-08-22 MED ORDER — PHENYLEPHRINE HCL-NACL 20-0.9 MG/250ML-% IV SOLN
INTRAVENOUS | Status: DC | PRN
  Administered 2024-08-22: 35 ug/min via INTRAVENOUS

## 2024-08-22 MED ORDER — POVIDONE-IODINE 10 % EX SWAB
2.0000 | Freq: Once | CUTANEOUS | Status: AC
  Administered 2024-08-22: 2 via TOPICAL

## 2024-08-22 MED ORDER — ROCURONIUM BROMIDE 10 MG/ML (PF) SYRINGE
PREFILLED_SYRINGE | INTRAVENOUS | Status: DC | PRN
  Administered 2024-08-22 (×4): 20 mg via INTRAVENOUS
  Administered 2024-08-22: 80 mg via INTRAVENOUS

## 2024-08-22 MED ORDER — PROPOFOL 10 MG/ML IV BOLUS
INTRAVENOUS | Status: AC
  Filled 2024-08-22: qty 20

## 2024-08-22 MED ORDER — ENOXAPARIN SODIUM 40 MG/0.4ML IJ SOSY
40.0000 mg | PREFILLED_SYRINGE | INTRAMUSCULAR | Status: DC
  Administered 2024-08-23: 40 mg via SUBCUTANEOUS
  Filled 2024-08-22: qty 0.4

## 2024-08-22 MED ORDER — MIDAZOLAM HCL 2 MG/2ML IJ SOLN
INTRAMUSCULAR | Status: AC
  Filled 2024-08-22: qty 2

## 2024-08-22 MED ORDER — MONTELUKAST SODIUM 10 MG PO TABS
10.0000 mg | ORAL_TABLET | Freq: Every day | ORAL | Status: DC
  Administered 2024-08-22: 10 mg via ORAL
  Filled 2024-08-22 (×2): qty 1

## 2024-08-22 MED ORDER — BUPIVACAINE LIPOSOME 1.3 % IJ SUSP
INTRAMUSCULAR | Status: DC | PRN
  Administered 2024-08-22: 50 mL

## 2024-08-22 MED ORDER — TOPIRAMATE 25 MG PO TABS
50.0000 mg | ORAL_TABLET | Freq: Two times a day (BID) | ORAL | Status: DC
  Administered 2024-08-22 – 2024-08-23 (×2): 50 mg via ORAL
  Filled 2024-08-22 (×3): qty 2

## 2024-08-22 MED ORDER — HYDROMORPHONE HCL 1 MG/ML IJ SOLN
0.2000 mg | INTRAMUSCULAR | Status: DC | PRN
  Administered 2024-08-22: 0.2 mg via INTRAVENOUS
  Administered 2024-08-23: 0.6 mg via INTRAVENOUS
  Filled 2024-08-22 (×2): qty 1

## 2024-08-22 MED ORDER — METHYLENE BLUE 20 MG/2ML IV SOSY
PREFILLED_SYRINGE | INTRAVENOUS | Status: AC
Start: 2024-08-22 — End: 2024-08-22
  Filled 2024-08-22: qty 2

## 2024-08-22 MED ORDER — OXYCODONE HCL 5 MG/5ML PO SOLN: 5.0000 mg | Freq: Once | ORAL | Status: DC | PRN

## 2024-08-22 MED ORDER — PROPOFOL 10 MG/ML IV BOLUS
INTRAVENOUS | Status: DC | PRN
  Administered 2024-08-22: 160 mg via INTRAVENOUS

## 2024-08-22 MED ORDER — PHENYLEPHRINE 80 MCG/ML (10ML) SYRINGE FOR IV PUSH (FOR BLOOD PRESSURE SUPPORT)
PREFILLED_SYRINGE | INTRAVENOUS | Status: DC | PRN
  Administered 2024-08-22 (×4): 80 ug via INTRAVENOUS

## 2024-08-22 MED ORDER — VASOPRESSIN 20 UNIT/ML IV SOLN
INTRAVENOUS | Status: AC
Start: 2024-08-22 — End: 2024-08-22
  Filled 2024-08-22: qty 1

## 2024-08-22 MED ORDER — SIMETHICONE 80 MG PO CHEW: 80.0000 mg | CHEWABLE_TABLET | Freq: Four times a day (QID) | ORAL | Status: DC | PRN

## 2024-08-22 MED ORDER — METRONIDAZOLE 500 MG/100ML IV SOLN
INTRAVENOUS | Status: AC
  Filled 2024-08-22: qty 100

## 2024-08-22 MED ORDER — DEXAMETHASONE SOD PHOSPHATE PF 10 MG/ML IJ SOLN
INTRAMUSCULAR | Status: DC | PRN
Start: 2024-08-22 — End: 2024-08-22
  Administered 2024-08-22: 10 mg via INTRAVENOUS

## 2024-08-22 MED ORDER — LACTATED RINGERS IV SOLN
INTRAVENOUS | Status: AC
Start: 2024-08-22 — End: 2024-08-23

## 2024-08-22 SURGICAL SUPPLY — 43 items
CHLORAPREP W/TINT 26 (MISCELLANEOUS) ×2 IMPLANT
COVER MAYO STAND STRL (DRAPES) ×2 IMPLANT
COVER SURGICAL LIGHT HANDLE (MISCELLANEOUS) IMPLANT
DERMABOND ADVANCED .7 DNX12 (GAUZE/BANDAGES/DRESSINGS) ×2 IMPLANT
DRAPE WARM FLUID 44X44 (DRAPES) ×2 IMPLANT
ELECT NDL BLADE 2-5/6 (NEEDLE) IMPLANT
ELECT NEEDLE BLADE 2-5/6 (NEEDLE) ×2 IMPLANT
ELECTRODE BLDE 4.0 EZ CLN MEGD (MISCELLANEOUS) IMPLANT
GAUZE 4X4 16PLY ~~LOC~~+RFID DBL (SPONGE) ×4 IMPLANT
GLOVE BIO SURGEON STRL SZ7 (GLOVE) ×4 IMPLANT
GLOVE BIOGEL PI IND STRL 7.0 (GLOVE) ×4 IMPLANT
GLOVE BIOGEL PI IND STRL 7.5 (GLOVE) IMPLANT
GOWN STRL REUS W/ TWL XL LVL3 (GOWN DISPOSABLE) ×2 IMPLANT
HEMOSTAT ARISTA ABSORB 3G PWDR (HEMOSTASIS) IMPLANT
HIBICLENS CHG 4% 4OZ BTL (MISCELLANEOUS) ×4 IMPLANT
KIT TURNOVER KIT B (KITS) ×2 IMPLANT
LIGASURE LAP MARYLAND 5MM 37CM (ELECTROSURGICAL) IMPLANT
MANIFOLD NEPTUNE II (INSTRUMENTS) ×2 IMPLANT
NDL HYPO 18GX1.5 BLUNT FILL (NEEDLE) IMPLANT
NDL HYPO 22X1.5 SAFETY MO (MISCELLANEOUS) IMPLANT
NEEDLE HYPO 18GX1.5 BLUNT FILL (NEEDLE) ×4 IMPLANT
NEEDLE HYPO 22X1.5 SAFETY MO (MISCELLANEOUS) ×2 IMPLANT
PACK ABDOMINAL GYN (CUSTOM PROCEDURE TRAY) ×2 IMPLANT
PAD ARMBOARD POSITIONER FOAM (MISCELLANEOUS) ×2 IMPLANT
PAD OB MATERNITY 11 LF (PERSONAL CARE ITEMS) ×2 IMPLANT
POWDER SURGICEL 3.0 GRAM (HEMOSTASIS) IMPLANT
RTRCTR C-SECT PINK 25CM LRG (MISCELLANEOUS) IMPLANT
SET CYSTO IRRIGATION (SET/KITS/TRAYS/PACK) ×2 IMPLANT
SHEET LAVH (DRAPES) ×2 IMPLANT
SOLN 0.9% NACL POUR BTL 1000ML (IV SOLUTION) ×4 IMPLANT
SPIKE FLUID TRANSFER (MISCELLANEOUS) IMPLANT
SPONGE T-LAP 18X18 ~~LOC~~+RFID (SPONGE) IMPLANT
SUT MNCRL AB 3-0 PS2 18 (SUTURE) IMPLANT
SUT MNCRL AB 4-0 PS2 18 (SUTURE) IMPLANT
SUT VIC AB 0 CT1 18XCR BRD8 (SUTURE) IMPLANT
SUT VIC AB 0 CT1 27XBRD ANBCTR (SUTURE) IMPLANT
SUT VIC AB 0 CT2 27 (SUTURE) IMPLANT
SUT VIC AB 3-0 CT1 TAPERPNT 27 (SUTURE) IMPLANT
SUT VIC AB 3-0 SH 27X BRD (SUTURE) IMPLANT
SUT VICRYL 0 TIES 12 18 (SUTURE) IMPLANT
SYR 3ML LL SCALE MARK (SYRINGE) IMPLANT
SYR CONTROL 10ML LL (SYRINGE) IMPLANT
TRAY FOLEY W/BAG SLVR 14FR (SET/KITS/TRAYS/PACK) ×2 IMPLANT

## 2024-08-22 NOTE — Anesthesia Preprocedure Evaluation (Addendum)
 Anesthesia Evaluation  Patient identified by MRN, date of birth, ID band Patient awake    Reviewed: Allergy & Precautions, NPO status , Patient's Chart, lab work & pertinent test results, reviewed documented beta blocker date and time   Airway Mallampati: II  TM Distance: >3 FB Neck ROM: Full    Dental no notable dental hx. (+) Teeth Intact   Pulmonary neg pulmonary ROS   Pulmonary exam normal breath sounds clear to auscultation       Cardiovascular Normal cardiovascular exam+ dysrhythmias  Rhythm:Regular Rate:Normal  PVC's controlled with metoprolol   Neuro/Psych  Headaches  negative psych ROS   GI/Hepatic Neg liver ROS,GERD  ,,  Endo/Other  Hypothyroidism    Renal/GU negative Renal ROS  negative genitourinary   Musculoskeletal negative musculoskeletal ROS (+)    Abdominal  (+) + obese  Peds  Hematology   Anesthesia Other Findings   Reproductive/Obstetrics Menorrhagia Endometrial polyp HSV                              Anesthesia Physical Anesthesia Plan  ASA: II  Anesthesia Plan: General   Post-op Pain Management: Tylenol  PO (pre-op)*   Induction: Intravenous  PONV Risk Score and Plan: 3 and Midazolam , Dexamethasone , Ondansetron  and Treatment may vary due to age or medical condition  Airway Management Planned: Oral ETT  Additional Equipment:   Intra-op Plan:   Post-operative Plan: Extubation in OR  Informed Consent: I have reviewed the patients History and Physical, chart, labs and discussed the procedure including the risks, benefits and alternatives for the proposed anesthesia with the patient or authorized representative who has indicated his/her understanding and acceptance.     Dental advisory given  Plan Discussed with: CRNA, Anesthesiologist and Surgeon  Anesthesia Plan Comments:          Anesthesia Quick Evaluation

## 2024-08-22 NOTE — Anesthesia Postprocedure Evaluation (Signed)
 Anesthesia Post Note  Patient: Christie Barron  Procedure(s) Performed: HYSTERECTOMY, TOTAL, ABDOMINAL, WITH SALPINGECTOMY (Bilateral: Abdomen) CYSTOSCOPY (Bilateral: Bladder)     Patient location during evaluation: PACU Anesthesia Type: General Level of consciousness: awake and alert Pain management: pain level controlled Vital Signs Assessment: post-procedure vital signs reviewed and stable Respiratory status: spontaneous breathing, nonlabored ventilation and respiratory function stable Cardiovascular status: blood pressure returned to baseline and stable Postop Assessment: no apparent nausea or vomiting Anesthetic complications: no   No notable events documented.  Last Vitals:  Vitals:   08/22/24 1430 08/22/24 1500  BP: 107/64 109/64  Pulse: 83 89  Resp: 16 16  Temp:  36.6 C  SpO2: 95% 99%    Last Pain:  Vitals:   08/22/24 1500  TempSrc: Oral  PainSc: 5                  Butler Levander Pinal

## 2024-08-22 NOTE — Transfer of Care (Signed)
 Immediate Anesthesia Transfer of Care Note  Patient: Christie Barron  Procedure(s) Performed: HYSTERECTOMY, TOTAL, ABDOMINAL, WITH SALPINGECTOMY (Bilateral: Abdomen) CYSTOSCOPY (Bilateral: Bladder)  Patient Location: PACU  Anesthesia Type:General  Level of Consciousness: awake, alert , oriented, and patient cooperative  Airway & Oxygen Therapy: Patient Spontanous Breathing and Patient connected to face mask oxygen  Post-op Assessment: Report given to RN, Post -op Vital signs reviewed and stable, Patient moving all extremities, and Patient moving all extremities X 4  Post vital signs: Reviewed and stable  Last Vitals:  Vitals Value Taken Time  BP 126/65 08/22/24 13:31  Temp 36.8 C 08/22/24 13:32  Pulse 81 08/22/24 13:35  Resp 15 08/22/24 13:35  SpO2 100 % 08/22/24 13:35  Vitals shown include unfiled device data.  Last Pain:  Vitals:   08/22/24 0703  TempSrc: Oral  PainSc: 0-No pain      Patients Stated Pain Goal: 5 (08/22/24 0703)  Complications: No notable events documented.

## 2024-08-22 NOTE — Interval H&P Note (Signed)
 History and Physical Interval Note:  08/22/2024 8:13 AM  Christie Barron Mailman  has presented today for surgery, with the diagnosis of Subserous leiomyoma of uterus.  The various methods of treatment have been discussed with the patient and family. After consideration of risks, benefits and other options for treatment, the patient has consented to  Procedure(s): HYSTERECTOMY, TOTAL, ABDOMINAL, WITH SALPINGECTOMY (Bilateral) CYSTOSCOPY (Bilateral) as a surgical intervention.  The patient's history has been reviewed, patient examined, no change in status, stable for surgery.  I have reviewed the patient's chart and labs.  Questions were answered to the patient's satisfaction.     Wai Minotti LULLA Pa

## 2024-08-22 NOTE — Anesthesia Procedure Notes (Signed)
 Procedure Name: Intubation Date/Time: 08/22/2024 8:41 AM  Performed by: Arvell Edsel HERO, CRNAPre-anesthesia Checklist: Emergency Drugs available, Suction available, Patient being monitored, Timeout performed and Patient identified Patient Re-evaluated:Patient Re-evaluated prior to induction Oxygen Delivery Method: Circle system utilized Preoxygenation: Pre-oxygenation with 100% oxygen Induction Type: IV induction Ventilation: Mask ventilation without difficulty and Oral airway inserted - appropriate to patient size Laryngoscope Size: Mac and 3 Grade View: Grade II Tube size: 7.0 mm Number of attempts: 1 Airway Equipment and Method: Patient positioned with wedge pillow and Stylet Placement Confirmation: ETT inserted through vocal cords under direct vision, positive ETCO2 and breath sounds checked- equal and bilateral Secured at: 22 cm Tube secured with: Tape Dental Injury: Teeth and Oropharynx as per pre-operative assessment

## 2024-08-22 NOTE — Progress Notes (Signed)
 Subjective: * Day of Surgery * Procedure(s) (LRB): HYSTERECTOMY, TOTAL, ABDOMINAL, WITH SALPINGECTOMY (Bilateral) CYSTOSCOPY (Bilateral)  Patient reports pain as 5 on 0-10 scale.  Feels a bit drowsy. Tolerating ice chips.  Objective: Vital signs in last 24 hours: Temp:  [97.9 F (36.6 C)-98.4 F (36.9 C)] 97.9 F (36.6 C) (11/05 1500) Pulse Rate:  [79-93] 89 (11/05 1500) Resp:  [12-18] 16 (11/05 1500) BP: (104-126)/(64-96) 109/64 (11/05 1500) SpO2:  [91 %-100 %] 99 % (11/05 1500) Weight:  [83.9 kg] 83.9 kg (11/05 0703)  Intake/Output from previous day: No intake/output data recorded. Intake/Output this shift: Total I/O In: 2350 [I.V.:1800; IV Piggyback:550] Out: 450 [Urine:350; Blood:100]  Recent Labs    08/20/24 0930  HGB 12.9   Recent Labs    08/20/24 0930  WBC 5.8  RBC 4.56  HCT 40.2  PLT 341   Recent Labs    08/22/24 0723  NA 138  K 3.2*  CL 103  CO2 21*  BUN 7  CREATININE 0.83  GLUCOSE 96  CALCIUM 8.9   No results for input(s): LABPT, INR in the last 72 hours.  Physical Exam Vitals reviewed.  Constitutional:      General: She is not in acute distress. HENT:     Head: Normocephalic.  Eyes:     Extraocular Movements: Extraocular movements intact.  Cardiovascular:     Rate and Rhythm: Normal rate.  Pulmonary:     Effort: Pulmonary effort is normal.  Abdominal:     General: There is no distension.     Palpations: Abdomen is soft.     Tenderness: There is no guarding or rebound.     Comments: Incision, c/d/I, skin glue in place  Genitourinary:    Comments: Foley in place, draining clear urine Musculoskeletal:        General: Swelling present.  Skin:    General: Skin is warm.  Neurological:     Mental Status: She is alert.  Psychiatric:        Mood and Affect: Mood normal.     Assessment/Plan: * Day of Surgery * Procedure(s) (LRB): HYSTERECTOMY, TOTAL, ABDOMINAL, WITH SALPINGECTOMY (Bilateral) CYSTOSCOPY (Bilateral)  Continue  IVF overnight, labs 4hr postop and in AM Advance diet as tolerated OOB per ERAS Maintain foley Pain meds per ERAS Plan for abdominal binder DVT ppx: SCDs and lovenox  Kreg Earhart V Sarabi Sockwell 08/22/2024, 3:07 PM

## 2024-08-22 NOTE — Op Note (Signed)
 08/22/24  [id number] Montie LITTIE Mailman   OPERATIVE REPORT   Preop Diagnosis: Subserous leiomyoma of uterus Postop Diagnosis: same Procedure: Procedure(s): HYSTERECTOMY, TOTAL, ABDOMINAL, WITH BILATERAL SALPINGECTOMY, CYSTOSCOPY  Surgeon: Vera LULLA Pa, MD  Assistant: Circulator: Raguel Sherra LITTIE, RN Relief Circulator: Gwenith Powell PARAS, RN Relief Scrub: Neysa Joesph BROCKS Scrub Person: Morgan Darnel, CST RN First Assistant: Jackquline Judyann LITTIE, RN   Fluids: 1500 cc crystalloid Urine output: 300 cc   Complications: None Anesthesia: General    Findings:  18 cm uterus with fundal fibroid, normal  tubes, right ovary with hemorrhagic cyst. Utero-vesicular adhesions present. Cystoscopy at the end of the case with normal bladder and patent ureters bilaterally.  Bilateral jets noted.   Estimated blood loss: 300cc   Specimens:  ID Type Source Tests Collected by Time Destination  1 : Uterus, cervix, bilateral fallopian tubes Tissue PATH Gyn benign resection SURGICAL PATHOLOGY Pa Vera LULLA, MD 08/22/2024 1133      Disposition of specimen: Pathology   Patient is taken to the operating room. She is placed in dorsal lithotomy position. She is a running IV in place. Informed consent was present on the chart. SCDs on her lower extremities and functioning properly. General endotracheal anesthesia was administered by the anesthesia staff without difficulty. Her legs were placed in the low lithotomy position in [place] stirrups. She was then examined under anesthesia with the above noted findings.   The abdomen was prepped, and Hibiclens was used to prep the inner thighs, perineum and vagina.  A Foley catheter was inserted into the bladder.  Once 3 minutes had past the patient was draped in a normal standard fashion. A proper time out was performed and everyone agreed.     The procedure was begun by performing a Pfannenstiel skin Incision with the scalpel. The incision was extended to  the level of the fascia using Bovie coagulation. The fascia was scored bilaterally at the midline. The fascial incision was then extended in a curvilinear fashion using the Mayo scissors. The fascia was dissected from the muscular area below by both sharp and blunt dissection. The muscular area was dissected along the midline by both sharp and blunt dissection. The peritoneum was subsequently entered sharply. The incision was then extended bluntly. Immediate notation was made of a large uterine fibroid. Alexis retractor was placed. The uterus was then delivered through the incision. The right and left ovary were within normal limits. The abdominal contents were packed in the upper abdomen using wet laps.  10 units of vasopressin was dilated in 20 units of normal saline.  5 cc of this vasopressin solution was injected bilaterally in the lower uterine segment, for total of 10 cc.  Two large Kelly clamps were placed above the cornual portion of the uterus. The right fallopian tube was cauterized and transected along the mesosalpinx.  Attention was turned to the right round ligament.  Right round ligament was cauterized and transected using the Ligasure.  The utero-ovarian ligament was then cauterized and transected. The anterior and posterior peritoneum of the inferior leaf of the broad ligament were opened. The beginning of the bladder flap was created. Utero-vesicular adhesions were lysed using judicious electrocautery and blunt dissection.  The bladder was taken down to the level of the external cervical os. Bladder was backfilled with methylene blue. Negative spill, however thin serosal layer noted. The right uterine artery was skeletonized and then serially clamped, cut, and suture ligated with 0 vicryl.   The same procedure was completed  on the left to transect the left fallopian tube, round, and utero-ovarian ligaments. The remainder of the bladder flap was created using sharp and blunt dissection.    Cardinal ligaments on either side were subsequently clamped, cut and suture ligated to the level of the uterosacral ligaments. An EEA sizer was inserted into the vagina and directed into the anterior vaginal fornix. This was used make the colpotomy with a combination of electrocautery and sharp dissection. The specimen was subsequently dissected free. The specimen was passed off the operative field for histological evaluation.  The vaginal cuff was also closed with 0 Vicryl suture using serial figure-of-eight sutures. Bladder serosa was reinforced with 3-0 vicryl in a running locked fashion. Subsequently, the pelvis was irrigated until clear.   Cystoscopy was performed with bilateral jets noted. No suture was noted within the bladder. Foley was replaced.  Points of bleeding were secured by Bovie coagulation. Arista was applied to surgical beds. Once hemostasis was achieved, the retractor was subsequently removed. Laps were subsequently removed, and the fascia was approximated with 0 Vicryl with suture in a running fashion.   For postoperative analgesia, 1.3% Exparel (20ml) + 0.5% bupivacaine (30ml) was infiltrated at the subcutaneous layer for a total of 50 cc.The subcutaneous layer was reapproximated with 3 Vicryl suture in a running fashion.  The skin was closed with 3-0 Vicryl in a subcuticular fashion. The skin was cleansed, and a pressure dressing was applied.  Speculum examine revealed an intact vaginal cuff. Foley remained in place. Sponge, lap, needle, instrument counts were correct x2. The patient tolerated the procedure well. The patient was extubated and transferred to recovery room in stable condition.    Vera LULLA Pa, MD

## 2024-08-22 NOTE — Plan of Care (Signed)

## 2024-08-23 ENCOUNTER — Ambulatory Visit: Payer: Self-pay | Admitting: Obstetrics and Gynecology

## 2024-08-23 ENCOUNTER — Encounter (HOSPITAL_COMMUNITY): Payer: Self-pay | Admitting: Obstetrics and Gynecology

## 2024-08-23 DIAGNOSIS — D259 Leiomyoma of uterus, unspecified: Secondary | ICD-10-CM | POA: Diagnosis not present

## 2024-08-23 LAB — CBC
HCT: 27.4 % — ABNORMAL LOW (ref 36.0–46.0)
HCT: 27.1 % — ABNORMAL LOW (ref 36.0–46.0)
Hemoglobin: 8.9 g/dL — ABNORMAL LOW (ref 12.0–15.0)
Hemoglobin: 8.9 g/dL — ABNORMAL LOW (ref 12.0–15.0)
MCH: 28.4 pg (ref 26.0–34.0)
MCH: 29.1 pg (ref 26.0–34.0)
MCHC: 32.5 g/dL (ref 30.0–36.0)
MCHC: 32.8 g/dL (ref 30.0–36.0)
MCV: 87.5 fL (ref 80.0–100.0)
MCV: 88.6 fL (ref 80.0–100.0)
Platelets: 231 K/uL (ref 150–400)
Platelets: 223 K/uL (ref 150–400)
RBC: 3.13 MIL/uL — ABNORMAL LOW (ref 3.87–5.11)
RBC: 3.06 MIL/uL — ABNORMAL LOW (ref 3.87–5.11)
RDW: 14.9 % (ref 11.5–15.5)
RDW: 15 % (ref 11.5–15.5)
WBC: 8.6 K/uL (ref 4.0–10.5)
WBC: 7.1 K/uL (ref 4.0–10.5)
nRBC: 0 % (ref 0.0–0.2)
nRBC: 0 % (ref 0.0–0.2)

## 2024-08-23 LAB — BASIC METABOLIC PANEL WITH GFR
Anion gap: 11 (ref 5–15)
BUN: <5 mg/dL — ABNORMAL LOW (ref 6–20)
CO2: 22 mmol/L (ref 22–32)
Calcium: 8 mg/dL — ABNORMAL LOW (ref 8.9–10.3)
Chloride: 105 mmol/L (ref 98–111)
Creatinine, Ser: 0.82 mg/dL (ref 0.44–1.00)
GFR, Estimated: >60 mL/min (ref >60)
Glucose, Bld: 97 mg/dL (ref 70–99)
Potassium: 3.3 mmol/L — ABNORMAL LOW (ref 3.5–5.1)
Sodium: 138 mmol/L (ref 135–145)

## 2024-08-23 LAB — SURGICAL PATHOLOGY

## 2024-08-23 MED ORDER — NITROFURANTOIN 25 MG/5ML PO SUSP: 100.0000 mg | Freq: Every day | ORAL | 0 refills | Status: DC

## 2024-08-23 MED ORDER — METOCLOPRAMIDE HCL 5 MG/ML IJ SOLN
10.0000 mg | Freq: Four times a day (QID) | INTRAMUSCULAR | Status: DC
  Administered 2024-08-23: 10 mg via INTRAVENOUS
  Filled 2024-08-23: qty 2

## 2024-08-23 MED ORDER — METHOCARBAMOL 500 MG PO TABS: 500.0000 mg | ORAL_TABLET | Freq: Three times a day (TID) | ORAL | 0 refills | Status: DC | PRN

## 2024-08-23 MED ORDER — METHOCARBAMOL 1000 MG/10ML IJ SOLN
500.0000 mg | Freq: Three times a day (TID) | INTRAMUSCULAR | Status: DC
  Administered 2024-08-23: 500 mg via INTRAVENOUS
  Filled 2024-08-23 (×3): qty 5

## 2024-08-23 MED ORDER — METOCLOPRAMIDE HCL 5 MG/5ML PO SOLN
10.0000 mg | Freq: Four times a day (QID) | ORAL | Status: DC | PRN
Start: 2024-08-23 — End: 2024-08-23

## 2024-08-23 MED ORDER — OXYCODONE HCL 5 MG PO TABS: 5.0000 mg | ORAL_TABLET | ORAL | Status: DC | PRN

## 2024-08-23 MED ORDER — IBUPROFEN 100 MG/5ML PO SUSP
800.0000 mg | Freq: Three times a day (TID) | ORAL | Status: DC
  Administered 2024-08-23: 800 mg via ORAL
  Filled 2024-08-23: qty 40

## 2024-08-23 MED ORDER — SENNOSIDES 8.8 MG/5ML PO SYRP
5.0000 mL | ORAL_SOLUTION | Freq: Two times a day (BID) | ORAL | Status: DC
  Filled 2024-08-23 (×2): qty 5

## 2024-08-23 MED ORDER — ONDANSETRON 4 MG PO TBDP: 4.0000 mg | ORAL_TABLET | Freq: Three times a day (TID) | ORAL | 0 refills | Status: DC | PRN

## 2024-08-23 MED ORDER — LEVOTHYROXINE SODIUM 25 MCG PO TABS: 75.0000 ug | ORAL_TABLET | Freq: Every day | ORAL | Status: DC

## 2024-08-23 MED ORDER — POTASSIUM CHLORIDE 20 MEQ PO PACK
20.0000 meq | PACK | Freq: Two times a day (BID) | ORAL | Status: DC
  Administered 2024-08-23: 20 meq via ORAL
  Filled 2024-08-23: qty 1

## 2024-08-23 MED ORDER — IBUPROFEN 800 MG PO TABS: 800.0000 mg | ORAL_TABLET | Freq: Three times a day (TID) | ORAL | 0 refills | Status: DC | PRN

## 2024-08-23 MED ORDER — OXYCODONE HCL 5 MG/5ML PO SOLN: 5.0000 mg | Freq: Four times a day (QID) | ORAL | 0 refills | Status: AC | PRN

## 2024-08-23 MED ORDER — OXYCODONE HCL 5 MG/5ML PO SOLN: 5.0000 mg | ORAL | Status: DC | PRN

## 2024-08-23 NOTE — Plan of Care (Signed)

## 2024-08-23 NOTE — Discharge Instructions (Signed)
 Take 1000mg  tylenol  and 800mg  ibuprofen  every 8 hours for the first week and then every 8 hours as needed. Take other pain medications as prescribed.

## 2024-08-23 NOTE — Discharge Summary (Signed)
 Physician Discharge Summary   Patient ID: Christie Barron [id number] 43 y.o. 1980/11/07  Admit date: 08/22/2024  Discharge date and time: 08/23/24 6:19 PM  Admitting Physician: Vera Dallie GAILS, MD   Discharge Physician: Vera GAILS Dallie, MD  Admission Diagnoses: Subserous leiomyoma of uterus [D25.2] Uterine fibroid [D25.9]  Discharge Diagnoses: same  Admission Condition: good  Discharged Condition: stable  Indication for Admission: scheduled surgery  Hospital Course:  Christie Barron is a 43 y.o. female who presented for scheduled Procedure(s): HYSTERECTOMY, TOTAL, ABDOMINAL, WITH SALPINGECTOMY, CYSTOSCOPY.  Her procedure was complicated by serosal bladder injury requiring intraoperative repair.  She was admitted for overnight observation. Foley catheter remained in place in the bladder. Her pain was well-controlled, and she was ambulating without assistance. Urine output was excellent. She was tolerating a regular diet.  She was passing flatus. Hgb remained stable at 8.9. She met criteria for discharge on POD#1. She was discharge with macrobid for UTI prophylaxis. Discharge instructions were reviewed with patient prior to discharge.   Consults: None  Significant Diagnostic Studies: labs as noted  Treatments: IV hydration, analgesia: acetaminophen , Dilaudid, and ibuprofen , robaxin   Discharge Exam: Constitutional:      General: She is not in acute distress. HENT:     Head: Normocephalic.  Eyes:     Extraocular Movements: Extraocular movements intact.  Cardiovascular:     Rate and Rhythm: Normal rate.    Pulses: Normal pulses.  Pulmonary:     Effort: Pulmonary effort is normal. No respiratory distress.  Abdominal:     General: Bowel sounds are normal. There is no distension.     Palpations: Abdomen is soft.     Tenderness: Tenderness: mild. There is no guarding or rebound.     Comments: Steri strips applied to incision. Genitourinary:    Comments: Foley in place,  draining clear urine Musculoskeletal:        General: No swelling.     Comments: SCDs in place  Skin:    General: Skin is warm and dry.  Neurological:     General: No focal deficit present.     Mental Status: She is alert and oriented to person, place, and time.  Psychiatric:        Mood and Affect: Mood normal.   Disposition: Discharge disposition: 01-Home or Self Care     Patient Instructions:  Allergies as of 08/23/2024       Reactions   Tape Rash   ADHESIVE---  RED RASH   Zithromax [azithromycin] Other (See Comments)   Caused weird heart palpitations type of issues        Medication List     TAKE these medications    acetaminophen  500 MG tablet Commonly known as: TYLENOL  Take 500 mg by mouth every 6 (six) hours as needed.   Azelastine HCl 137 MCG/SPRAY Soln Place 1 spray into the nose 2 (two) times daily.   calcium carbonate 500 MG chewable tablet Commonly known as: TUMS - dosed in mg elemental calcium Chew 1 tablet by mouth as needed for indigestion or heartburn.   ibuprofen  800 MG tablet Commonly known as: ADVIL  Take 1 tablet (800 mg total) by mouth every 8 (eight) hours as needed for up to 42 doses. What changed:  medication strength how much to take when to take this   levocetirizine 5 MG tablet Commonly known as: XYZAL Take 5 mg by mouth at bedtime.   levothyroxine 75 MCG tablet Commonly known as: SYNTHROID Take 75 mcg by mouth  daily before breakfast.   Lysine 1000 MG Tabs Take 0.5 tablets by mouth 2 (two) times daily.   methocarbamol  500 MG tablet Commonly known as: ROBAXIN  Take 1 tablet (500 mg total) by mouth every 8 (eight) hours as needed for up to 14 days for muscle spasms.   metoprolol tartrate 25 MG tablet Commonly known as: LOPRESSOR Take 12.5 mg by mouth 2 (two) times daily.   montelukast 10 MG tablet Commonly known as: SINGULAIR Take 10 mg by mouth at bedtime.   Mucinex 600 MG 12 hr tablet Generic drug:  guaiFENesin Take 600 mg by mouth 2 (two) times daily as needed for to loosen phlegm.   MULTIVITAMIN GUMMIES ADULTS PO Take 2 each by mouth daily.   nitrofurantoin 25 MG/5ML suspension Commonly known as: FURADANTIN Take 20 mLs (100 mg total) by mouth at bedtime for 13 days.   ondansetron  4 MG disintegrating tablet Commonly known as: ZOFRAN -ODT Take 1 tablet (4 mg total) by mouth every 8 (eight) hours as needed for nausea or vomiting.   oxyCODONE 5 MG/5ML solution Commonly known as: ROXICODONE Take 5 mLs (5 mg total) by mouth every 6 (six) hours as needed for up to 5 days for severe pain (pain score 7-10).   topiramate 100 MG tablet Commonly known as: TOPAMAX Take 50 mg by mouth 2 (two) times daily.   valACYclovir 1000 MG tablet Commonly known as: VALTREX Take 1,000 mg by mouth as needed.   Vitamin C 250 MG Chew Chew 1 each by mouth daily.   Vitamin D3 50 MCG (2000 UT) Chew Chew 1 each by mouth daily.               Discharge Care Instructions  (From admission, onward)           Start     Ordered   08/23/24 0000  Discharge wound care:       Comments: Remove steri strips in 7 days.   08/23/24 1757   08/23/24 0000  Leave dressing on - Keep it clean, dry, and intact until clinic visit        08/23/24 1757           Activity: ambulate in house, no driving for 2 weeks, no sex for 10 weeks, and no heavy lifting for 6 weeks Diet: regular diet Wound Care: keep wound clean and dry and routine catheter care, remove steri strips in 1wk  Follow-up with me in 2 week.  SignedBETHA Vera LULLA Dallie 08/23/2024 6:19 PM

## 2024-08-23 NOTE — Progress Notes (Addendum)
 Subjective: 1 Day Post-Op Procedure(s) (LRB): HYSTERECTOMY, TOTAL, ABDOMINAL, WITH SALPINGECTOMY (Bilateral) CYSTOSCOPY (Bilateral)  Patient reports pain as 5 on 0-10 scale.  Tolerating liquids. Had some nausea overnight. +flatus. Ambulated this morning. No fevers, chills, SOB, CP.  Objective: Vital signs in last 24 hours: Temp:  [97.9 F (36.6 C)-98.6 F (37 C)] 98.1 F (36.7 C) (11/06 0813) Pulse Rate:  [79-94] 94 (11/06 1028) Resp:  [12-18] 17 (11/06 1028) BP: (104-126)/(54-96) 109/61 (11/06 1028) SpO2:  [91 %-100 %] 100 % (11/06 1028)  Intake/Output from previous day: 11/05 0701 - 11/06 0700 In: 4089.8 [P.O.:720; I.Christie.:2819.8; IV Piggyback:550] Out: 3275 [Urine:3175; Blood:100] Intake/Output this shift: Total I/O In: -  Out: 950 [Urine:950]  Recent Labs    08/22/24 1818 08/23/24 0444  HGB 9.6* 8.9*   Recent Labs    08/22/24 1818 08/23/24 0444  WBC 10.7* 8.6  RBC 3.40* 3.13*  HCT 30.1* 27.4*  PLT 213 231   Recent Labs    08/22/24 0723 08/23/24 0444  NA 138 138  K 3.2* 3.3*  CL 103 105  CO2 21* 22  BUN 7 <5*  CREATININE 0.83 0.82  GLUCOSE 96 97  CALCIUM 8.9 8.0*   No results for input(s): LABPT, INR in the last 72 hours.  Physical Exam Vitals reviewed.  Constitutional:      General: She is not in acute distress. HENT:     Head: Normocephalic.  Eyes:     Extraocular Movements: Extraocular movements intact.  Cardiovascular:     Rate and Rhythm: Normal rate and regular rhythm.     Pulses: Normal pulses.     Heart sounds: Normal heart sounds.  Pulmonary:     Effort: Pulmonary effort is normal. No respiratory distress.     Breath sounds: Normal breath sounds. No wheezing or rales.  Abdominal:     General: Bowel sounds are normal. There is no distension.     Palpations: Abdomen is soft.     Tenderness: Tenderness: mild. There is no guarding or rebound.     Comments: Pressure dressing in place.  Genitourinary:    Comments: Foley in place,  draining clear urine Musculoskeletal:        General: No swelling.     Comments: SCDs in place  Skin:    General: Skin is warm and dry.  Neurological:     General: No focal deficit present.     Mental Status: She is alert and oriented to person, place, and time.  Psychiatric:        Mood and Affect: Mood normal.     Assessment/Plan: 1 Day Post-Op Procedure(s) (LRB): HYSTERECTOMY, TOTAL, ABDOMINAL, WITH SALPINGECTOMY (Bilateral) CYSTOSCOPY (Bilateral)  Hgb 12.9 > 8.6, will repeat CBC today to ensure it stabilizes, EBL 300cc Decreased IVF to 57ml/hr Advance diet as tolerated OOB per ERAS Maintain foley due to thin serosal layer of bladder requiring repair, will discharge with foley and maintain for 2wk Pain meds per ERAS, will transition to PO Place abdominal binder, remove pressure dressing DVT ppx: SCDs and lovenox Home meds ordered  Christie Barron Christie Barron 08/23/2024, 11:52 AM

## 2024-08-29 ENCOUNTER — Telehealth: Payer: Self-pay | Admitting: Obstetrics and Gynecology

## 2024-08-29 NOTE — Telephone Encounter (Signed)
 Postop call to patient:  She is overall doing well. She notes that she resting HR has been ~62 and BP has been 110s/50s. Easily fatigues with movement and ocassional lightheadedness. No CP or SOB.  Pain: improving, having some bladder spasms from foley, robaxin  helps Analgesics: taking 1 tylenol , ibuprofen  and robaxin  per day Incision: planning to remove steri strips today, some bruising that is improving, no drainage  Tolerating bland diet, mild nausea (no need antiemetics) Flatus: yes BM: yes, diarrhea after taking macrobid  Drains: yes, foley, urine clear  Encouragement provided Recommend adding yogurt to diet given ppx macrobid with foley Continue MTV with iron F/u for postop in 1 week, call with any concerns

## 2024-09-05 ENCOUNTER — Ambulatory Visit: Payer: Self-pay | Admitting: Obstetrics and Gynecology

## 2024-09-05 ENCOUNTER — Ambulatory Visit (INDEPENDENT_AMBULATORY_CARE_PROVIDER_SITE_OTHER): Admitting: Obstetrics and Gynecology

## 2024-09-05 ENCOUNTER — Encounter: Payer: Self-pay | Admitting: Obstetrics and Gynecology

## 2024-09-05 VITALS — BP 120/74 | HR 94 | Temp 98.2°F | Wt 178.0 lb

## 2024-09-05 DIAGNOSIS — D649 Anemia, unspecified: Secondary | ICD-10-CM

## 2024-09-05 DIAGNOSIS — Z09 Encounter for follow-up examination after completed treatment for conditions other than malignant neoplasm: Secondary | ICD-10-CM

## 2024-09-05 DIAGNOSIS — N390 Urinary tract infection, site not specified: Secondary | ICD-10-CM

## 2024-09-05 DIAGNOSIS — R3915 Urgency of urination: Secondary | ICD-10-CM

## 2024-09-05 LAB — CBC
HCT: 36.8 % (ref 35.0–45.0)
Hemoglobin: 11.9 g/dL (ref 11.7–15.5)
MCH: 28.3 pg (ref 27.0–33.0)
MCHC: 32.3 g/dL (ref 32.0–36.0)
MCV: 87.4 fL (ref 80.0–100.0)
MPV: 10 fL (ref 7.5–12.5)
Platelets: 331 Thousand/uL (ref 140–400)
RBC: 4.21 Million/uL (ref 3.80–5.10)
RDW: 14.1 % (ref 11.0–15.0)
WBC: 6.8 Thousand/uL (ref 3.8–10.8)

## 2024-09-05 MED ORDER — SULFAMETHOXAZOLE-TRIMETHOPRIM 200-40 MG/5ML PO SUSP: 20.0000 mL | Freq: Two times a day (BID) | ORAL | 0 refills | Status: AC

## 2024-09-05 MED ORDER — SULFAMETHOXAZOLE-TRIMETHOPRIM 800-160 MG PO TABS: 1.0000 | ORAL_TABLET | Freq: Two times a day (BID) | ORAL | 0 refills | Status: DC

## 2024-09-05 NOTE — Progress Notes (Signed)
 43 y.o. G46P2002 female with CD x2, AUB, known fibroid, hypothyroidism, palpitations here for postop check 2 wk s/p TAH, BS, serosal bladder repair, cystoscopy. Married, vasectomy. Presents with her husband. Works hybrid schedule from [employer]. Former patient of Dr. Jertson.  Patient's last menstrual period was 08/08/2024.   Pt states she is doing well. Denies N/V, abdominal pain, VB, discharge. +urinary urgency with foley. Normal BM.  She reports nontender, white spots on lower abdomen above incision, noticed 3 days after surgery.  08/22/2024 path: A. UTERUS AND CERVIX, WITH BILATERAL FALLOPIAN TUBES, HYSTERECTOMY:  Cervix     Nabothian cysts      Negative for dysplasia  Endometrium     Proliferative endometrium      Negative for endometrial intraepithelial neoplasia (EIN) and  malignancy  Myometrium     Leiomyomata      Negative for malignancy  Uterine serosa      Fibrous adhesions      Negative for endometriosis and malignancy  Bilateral fallopian tubes      Benign paratubal cyst, unilateral      Negative for endometriosis and malignancy   GYN HISTORY: CD x2  OB History  Gravida Para Term Preterm AB Living  2 2 2   2   SAB IAB Ectopic Multiple Live Births      2    # Outcome Date GA Lbr Len/2nd Weight Sex Type Anes PTL Lv  2 Term 02/2009 [redacted]w[redacted]d  7 lb 15 oz (3.6 kg) F CS-Unspec   LIV  1 Term 12/2006 [redacted]w[redacted]d  8 lb 11 oz (3.941 kg) M CS-Unspec   LIV   Past Medical History:  Diagnosis Date   Abnormal uterine bleeding (AUB)    Allergic rhinitis    Constipation    Environmental allergies    Frequency of urination    GERD (gastroesophageal reflux disease)    Hydronephrosis, bilateral    CT imaging in epic 05-13-2024  stable mild hydronephrosis right > left , due to  large uterine fibroid   Hyperlipidemia, mixed    CTC  10-22-2022  calcium score = zero   Hypothyroidism    followed by pcp   IDA (iron deficiency anemia)    Intermittent palpitations    cardiologist-- Dr  Elmira;   last event monitor-- 06-19-2024  SR w/ rare PACs/PVCs, 1 episode NSVT x4 beats;  normal echo 05-28-2024   Leiomyoma of uterus    Migraine with aura    Wears glasses    Past Surgical History:  Procedure Laterality Date   CESAREAN SECTION  01/13/2007   @[facility]  by Dr KANDICE. Latisha   CYSTOSCOPY Bilateral 08/22/2024   Procedure: CYSTOSCOPY;  Surgeon: Dallie Vera GAILS, MD;  [facility] OR;  Service: Gynecology;  Laterality: Bilateral;   DILATATION & CURETTAGE/HYSTEROSCOPY WITH MYOSURE N/A 12/12/2017   Procedure: DILATATION & CURETTAGE/HYSTEROSCOPY;  Surgeon: Jannis Kate Norris, MD;  Location: [facility] ORS;  Service: Gynecology;  Laterality: N/A;   HYSTERECTOMY ABDOMINAL WITH SALPINGECTOMY Bilateral 08/22/2024   Procedure: HYSTERECTOMY, TOTAL, ABDOMINAL, WITH SALPINGECTOMY;  Surgeon: Dallie Vera GAILS, MD;  Location: [facility] OR;  Service: Gynecology;  Laterality: Bilateral;   REPEAT CESAREAN SECTION  03/07/2009   @[facility]  by Dr JONETTA. Marget   WISDOM TOOTH EXTRACTION  2013   Current Outpatient Medications on File Prior to Visit  Medication Sig Dispense Refill   acetaminophen  (TYLENOL ) 500 MG tablet Take 500 mg by mouth every 6 (six) hours as needed.     Ascorbic Acid (VITAMIN C) 250 MG CHEW  Chew 1 each by mouth daily.     Azelastine HCl 137 MCG/SPRAY SOLN Place 1 spray into the nose 2 (two) times daily.     calcium carbonate (TUMS - DOSED IN MG ELEMENTAL CALCIUM) 500 MG chewable tablet Chew 1 tablet by mouth as needed for indigestion or heartburn.     Cholecalciferol (VITAMIN D3) 50 MCG (2000 UT) CHEW Chew 1 each by mouth daily.     guaiFENesin (MUCINEX) 600 MG 12 hr tablet Take 600 mg by mouth 2 (two) times daily as needed for to loosen phlegm.     ibuprofen  (ADVIL ) 800 MG tablet Take 1 tablet (800 mg total) by mouth every 8 (eight) hours as needed for up to 42 doses. 42 tablet 0   levocetirizine (XYZAL) 5 MG tablet Take 5 mg by mouth at bedtime.      levothyroxine (SYNTHROID, LEVOTHROID) 75 MCG tablet Take  75 mcg by mouth daily before breakfast.     Lysine 1000 MG TABS Take 0.5 tablets by mouth 2 (two) times daily.     metoprolol tartrate (LOPRESSOR) 25 MG tablet Take 12.5 mg by mouth 2 (two) times daily.     montelukast (SINGULAIR) 10 MG tablet Take 10 mg by mouth at bedtime.     Multiple Vitamins-Minerals (MULTIVITAMIN GUMMIES ADULTS PO) Take 2 each by mouth daily.     topiramate (TOPAMAX) 100 MG tablet Take 50 mg by mouth 2 (two) times daily.     valACYclovir (VALTREX) 1000 MG tablet Take 1,000 mg by mouth as needed.  2   No current facility-administered medications on file prior to visit.   Allergies  Allergen Reactions   Tape Rash    ADHESIVE---  RED RASH   Zithromax [Azithromycin] Other (See Comments)    Caused weird heart palpitations type of issues      PE Today's Vitals   09/05/24 1557  BP: 120/74  Pulse: 94  Temp: 98.2 F (36.8 C)  TempSrc: Oral  SpO2: 99%  Weight: 178 lb (80.7 kg)    Body mass index is 32.04 kg/m.  Physical Exam Vitals reviewed.  Constitutional:      General: She is not in acute distress.    Appearance: Normal appearance.  HENT:     Head: Normocephalic and atraumatic.     Nose: Nose normal.  Eyes:     Extraocular Movements: Extraocular movements intact.     Conjunctiva/sclera: Conjunctivae normal.  Pulmonary:     Effort: Pulmonary effort is normal.  Abdominal:     General: There is no distension.     Palpations: Abdomen is soft.     Tenderness: There is no abdominal tenderness.     Comments: Pfannenstiel incision well healed. Suture trimmed along edge  Genitourinary:    Comments: Foley in place, draining clear urine Musculoskeletal:        General: Normal range of motion.     Cervical back: Normal range of motion.  Neurological:     General: No focal deficit present.     Mental Status: She is alert.  Psychiatric:        Mood and Affect: Mood normal.        Behavior: Behavior normal.    Urine collected from foley for UA with  UCx 10cc fluids removed from foley balloon and catheter removed without complications.   Assessment and Plan:        Postop check Normal postop exam.  Pathology reviewed with patient. Foley removed, Ucx sent All questions answered.  Postoperative anemia Postop Hgb 8.9, recheck today -     CBC  Urinary urgency -     Urinalysis,Complete w/RFL Culture    Vera LULLA Pa, MD

## 2024-09-07 LAB — URINALYSIS, COMPLETE W/RFL CULTURE
Bilirubin Urine: NEGATIVE
Glucose, UA: NEGATIVE
Hyaline Cast: NONE SEEN /LPF
Ketones, ur: NEGATIVE
Nitrites, Initial: POSITIVE — AB
Protein, ur: NEGATIVE
Specific Gravity, Urine: 1.01 (ref 1.001–1.035)
pH: 6.5 (ref 5.0–8.0)

## 2024-09-07 LAB — URINE CULTURE
MICRO NUMBER:: 17256513
SPECIMEN QUALITY:: ADEQUATE

## 2024-09-07 LAB — CULTURE INDICATED

## 2024-09-10 MED ORDER — AMOXICILLIN-POT CLAVULANATE 400-57 MG/5ML PO SUSR: 10.9000 mL | Freq: Two times a day (BID) | ORAL | 0 refills | Status: AC

## 2024-09-10 NOTE — Addendum Note (Signed)
 Addended by: DALLIE VERA GAILS on: 09/10/2024 08:51 AM   Modules accepted: Orders

## 2024-09-11 ENCOUNTER — Other Ambulatory Visit: Payer: Self-pay

## 2024-09-11 DIAGNOSIS — R3915 Urgency of urination: Secondary | ICD-10-CM

## 2024-09-18 ENCOUNTER — Other Ambulatory Visit (INDEPENDENT_AMBULATORY_CARE_PROVIDER_SITE_OTHER)

## 2024-09-18 DIAGNOSIS — R3915 Urgency of urination: Secondary | ICD-10-CM

## 2024-09-19 LAB — URINE CULTURE
MICRO NUMBER:: 17302277
SPECIMEN QUALITY:: ADEQUATE

## 2024-09-20 ENCOUNTER — Ambulatory Visit: Payer: Self-pay | Admitting: Obstetrics and Gynecology

## 2024-10-01 ENCOUNTER — Ambulatory Visit: Admitting: Obstetrics and Gynecology

## 2024-10-01 ENCOUNTER — Encounter: Payer: Self-pay | Admitting: Obstetrics and Gynecology

## 2024-10-01 ENCOUNTER — Encounter: Admitting: Obstetrics and Gynecology

## 2024-10-01 VITALS — BP 118/76 | HR 82 | Temp 98.2°F | Wt 184.0 lb

## 2024-10-01 DIAGNOSIS — R300 Dysuria: Secondary | ICD-10-CM

## 2024-10-01 DIAGNOSIS — Z09 Encounter for follow-up examination after completed treatment for conditions other than malignant neoplasm: Secondary | ICD-10-CM

## 2024-10-01 LAB — URINALYSIS, COMPLETE W/RFL CULTURE
Bacteria, UA: NONE SEEN /HPF
Bilirubin Urine: NEGATIVE
Glucose, UA: NEGATIVE
Hgb urine dipstick: NEGATIVE
Hyaline Cast: NONE SEEN /LPF
Ketones, ur: NEGATIVE
Leukocyte Esterase: NEGATIVE
Nitrites, Initial: NEGATIVE
Protein, ur: NEGATIVE
RBC / HPF: NONE SEEN /HPF (ref 0–2)
Specific Gravity, Urine: 1.025 (ref 1.001–1.035)
WBC, UA: NONE SEEN /HPF (ref 0–5)
pH: 7 (ref 5.0–8.0)

## 2024-10-01 LAB — NO CULTURE INDICATED

## 2024-10-01 NOTE — Progress Notes (Signed)
 43 y.o. G44P2002 female with CD x2, AUB, known fibroid here for postop check 6 wk s/p TAH, BS, serosal bladder repair, cystoscopy. Married, vasectomy. Works hybrid schedule from [employer]. Former patient of Dr. Jertson.  Patient's last menstrual period was 08/08/2024 (exact date).   Pt reports mild pelvic pain with urination. Happens when she pushes harder to empty bladder. Also happens with bowel movements. Overall tolerable but new since surgery. No dysuria. No pain outside of voiding or BM.  + Klebsiella UTI 09/05/2024, treated with Augmentin . 09/18/2024 with mixed urogenital flora. Denies CP, SOB, N/V, abdominal pain, VB, discharge. Occasional loose BM.  08/22/2024 path: A. UTERUS AND CERVIX, WITH BILATERAL FALLOPIAN TUBES, HYSTERECTOMY:  Cervix     Nabothian cysts      Negative for dysplasia  Endometrium     Proliferative endometrium      Negative for endometrial intraepithelial neoplasia (EIN) and  malignancy  Myometrium     Leiomyomata      Negative for malignancy  Uterine serosa      Fibrous adhesions      Negative for endometriosis and malignancy  Bilateral fallopian tubes      Benign paratubal cyst, unilateral      Negative for endometriosis and malignancy   GYN HISTORY: CD x2  OB History  Gravida Para Term Preterm AB Living  2 2 2   2   SAB IAB Ectopic Multiple Live Births      2    # Outcome Date GA Lbr Len/2nd Weight Sex Type Anes PTL Lv  2 Term 02/2009 [redacted]w[redacted]d  7 lb 15 oz (3.6 kg) F CS-Unspec   LIV  1 Term 12/2006 [redacted]w[redacted]d  8 lb 11 oz (3.941 kg) M CS-Unspec   LIV   Past Medical History:  Diagnosis Date   Abnormal uterine bleeding (AUB)    Allergic rhinitis    Constipation    Environmental allergies    Frequency of urination    GERD (gastroesophageal reflux disease)    Hydronephrosis, bilateral    CT imaging in epic 05-13-2024  stable mild hydronephrosis right > left , due to  large uterine fibroid   Hyperlipidemia, mixed    CTC  10-22-2022  calcium score =  zero   Hypothyroidism    followed by pcp   IDA (iron deficiency anemia)    Intermittent palpitations    cardiologist-- Dr Elmira;   last event monitor-- 06-19-2024  SR w/ rare PACs/PVCs, 1 episode NSVT x4 beats;  normal echo 05-28-2024   Leiomyoma of uterus    Migraine with aura    Wears glasses    Past Surgical History:  Procedure Laterality Date   CESAREAN SECTION  01/13/2007   @[facility]  by Dr KANDICE. Latisha   CYSTOSCOPY Bilateral 08/22/2024   Procedure: CYSTOSCOPY;  Surgeon: Christie Christie GAILS, MD;  Location: [facility] OR;  Service: Gynecology;  Laterality: Bilateral;   DILATATION & CURETTAGE/HYSTEROSCOPY WITH MYOSURE N/A 12/12/2017   Procedure: DILATATION & CURETTAGE/HYSTEROSCOPY;  Surgeon: Jannis Kate Norris, MD;  Location: [facility] ORS;  Service: Gynecology;  Laterality: N/A;   HYSTERECTOMY ABDOMINAL WITH SALPINGECTOMY Bilateral 08/22/2024   Procedure: HYSTERECTOMY, TOTAL, ABDOMINAL, WITH SALPINGECTOMY;  Surgeon: Christie Christie GAILS, MD;  Location: [facility] OR;  Service: Gynecology;  Laterality: Bilateral;   REPEAT CESAREAN SECTION  03/07/2009   @[facility]  by Dr Christie Barron   WISDOM TOOTH EXTRACTION  2013   Current Outpatient Medications on File Prior to Visit  Medication Sig Dispense Refill   acetaminophen  (TYLENOL ) 500 MG tablet Take  500 mg by mouth every 6 (six) hours as needed.     Ascorbic Acid (VITAMIN C) 250 MG CHEW Chew 1 each by mouth daily.     Azelastine HCl 137 MCG/SPRAY SOLN Place 1 spray into the nose 2 (two) times daily.     calcium carbonate (TUMS - DOSED IN MG ELEMENTAL CALCIUM) 500 MG chewable tablet Chew 1 tablet by mouth as needed for indigestion or heartburn.     Cholecalciferol (VITAMIN D3) 50 MCG (2000 UT) CHEW Chew 1 each by mouth daily.     guaiFENesin (MUCINEX) 600 MG 12 hr tablet Take 600 mg by mouth 2 (two) times daily as needed for to loosen phlegm.     ibuprofen  (ADVIL ) 800 MG tablet Take 1 tablet (800 mg total) by mouth every 8 (eight) hours as needed for up to 42 doses. 42 tablet 0    levocetirizine (XYZAL) 5 MG tablet Take 5 mg by mouth at bedtime.      levothyroxine  (SYNTHROID , LEVOTHROID) 75 MCG tablet Take 75 mcg by mouth daily before breakfast.     Lysine 1000 MG TABS Take 0.5 tablets by mouth 2 (two) times daily.     metoprolol  tartrate (LOPRESSOR ) 25 MG tablet Take 12.5 mg by mouth 2 (two) times daily.     montelukast  (SINGULAIR ) 10 MG tablet Take 10 mg by mouth at bedtime.     Multiple Vitamins-Minerals (MULTIVITAMIN GUMMIES ADULTS PO) Take 2 each by mouth daily.     topiramate  (TOPAMAX ) 100 MG tablet Take 50 mg by mouth 2 (two) times daily.     valACYclovir (VALTREX) 1000 MG tablet Take 1,000 mg by mouth as needed.  2   No current facility-administered medications on file prior to visit.   Allergies  Allergen Reactions   Tape Rash    ADHESIVE---  RED RASH   Zithromax [Azithromycin] Other (See Comments)    Caused weird heart palpitations type of issues      PE Today's Vitals   10/01/24 1502  BP: 118/76  Pulse: 82  Temp: 98.2 F (36.8 C)  TempSrc: Oral  SpO2: 99%  Weight: 184 lb (83.5 kg)    Body mass index is 33.12 kg/m.  Physical Exam Vitals reviewed. Exam conducted with a chaperone present.  Constitutional:      General: She is not in acute distress.    Appearance: Normal appearance.  HENT:     Head: Normocephalic and atraumatic.     Nose: Nose normal.  Eyes:     Extraocular Movements: Extraocular movements intact.     Conjunctiva/sclera: Conjunctivae normal.  Pulmonary:     Effort: Pulmonary effort is normal.  Abdominal:     General: There is no distension.     Palpations: Abdomen is soft.     Tenderness: There is no abdominal tenderness.     Comments: Pfannenstiel incision well healed.  Genitourinary:    General: Normal vulva.     Urethra: No prolapse or urethral swelling.     Vagina: Normal.     Comments: Vaginal cuff intact, no bleeding, no discharge, nontender Musculoskeletal:        General: Normal range of motion.      Cervical back: Normal range of motion.  Lymphadenopathy:     Lower Body: No right inguinal adenopathy. No left inguinal adenopathy.  Neurological:     General: No focal deficit present.     Mental Status: She is alert.  Psychiatric:        Mood and  Affect: Mood normal.        Behavior: Behavior normal.     Assessment and Plan:        Postop check  Dysuria -     Urinalysis,Complete w/RFL Culture  Other orders -     REFLEXIVE URINE CULTURE  Normal postop exam. Neg UA. Mild pain is likely from ongoing healing. Continue to monitor. Pathology reviewed with patient. Cleared to return to work and exercise. Continue pelvic rest until next exam. All questions answered.     Christie LULLA Pa, MD

## 2024-10-01 NOTE — Patient Instructions (Addendum)
 Continue pelvic rest for 6wk or longer if you are still spotting or cramping. This includes use of tampons, intercourse, douching, public bathing (including hot tubs, pools, the ocean).   Clean your incision daily with mild soapy water. Ensure that you thoroughly dry your wound following cleaning and keep dry throughout the day.  You can apply a thin layer of Aquaphor or Vaseline over your incision.

## 2024-11-12 ENCOUNTER — Ambulatory Visit: Admitting: Obstetrics and Gynecology

## 2024-11-14 ENCOUNTER — Ambulatory Visit: Admitting: Obstetrics and Gynecology

## 2024-11-14 ENCOUNTER — Encounter: Payer: Self-pay | Admitting: Obstetrics and Gynecology

## 2024-11-14 VITALS — BP 102/64 | HR 73 | Temp 98.4°F | Wt 187.0 lb

## 2024-11-14 DIAGNOSIS — N949 Unspecified condition associated with female genital organs and menstrual cycle: Secondary | ICD-10-CM

## 2024-11-14 DIAGNOSIS — Z09 Encounter for follow-up examination after completed treatment for conditions other than malignant neoplasm: Secondary | ICD-10-CM

## 2024-11-14 NOTE — Progress Notes (Signed)
 "  44 y.o. G60P2002 female with CD x2, AUB, known fibroid here for postop check 12 wk s/p TAH, BS, serosal bladder repair, cystoscopy. Married, vasectomy. Works hybrid schedule from [employer]. Former patient of Dr. Jertson.  Patient's last menstrual period was 08/08/2024.   Pt reports mild pelvic pain with urination and bowel movements has been less frequent. Overall mild, tolerable and improving. No dysuria. No pain outside of voiding or BM.  She also notices PMS sx more frequently: feeling more emotional, breast tenderness and bloating. Denies CP, SOB, N/V, VB, discharge.  + Klebsiella UTI 09/05/2024, treated with Augmentin . 09/18/2024 with mixed urogenital flora.  10/29/24 5.5cm Complex right adnexal cyst noted on outside renal US   08/22/2024 path: A. UTERUS AND CERVIX, WITH BILATERAL FALLOPIAN TUBES, HYSTERECTOMY:  Cervix     Nabothian cysts      Negative for dysplasia  Endometrium     Proliferative endometrium      Negative for endometrial intraepithelial neoplasia (EIN) and  malignancy  Myometrium     Leiomyomata      Negative for malignancy  Uterine serosa      Fibrous adhesions      Negative for endometriosis and malignancy  Bilateral fallopian tubes      Benign paratubal cyst, unilateral      Negative for endometriosis and malignancy   GYN HISTORY: CD x2  OB History  Gravida Para Term Preterm AB Living  2 2 2   2   SAB IAB Ectopic Multiple Live Births      2    # Outcome Date GA Lbr Len/2nd Weight Sex Type Anes PTL Lv  2 Term 02/2009 [redacted]w[redacted]d  7 lb 15 oz (3.6 kg) F CS-Unspec   LIV  1 Term 12/2006 [redacted]w[redacted]d  8 lb 11 oz (3.941 kg) M CS-Unspec   LIV   Past Medical History:  Diagnosis Date   Abnormal uterine bleeding (AUB)    Allergic rhinitis    Constipation    Environmental allergies    Frequency of urination    GERD (gastroesophageal reflux disease)    Hydronephrosis, bilateral    CT imaging in epic 05-13-2024  stable mild hydronephrosis right > left , due to  large  uterine fibroid   Hyperlipidemia, mixed    CTC  10-22-2022  calcium score = zero   Hypothyroidism    followed by pcp   IDA (iron deficiency anemia)    Intermittent palpitations    cardiologist-- Dr Elmira;   last event monitor-- 06-19-2024  SR w/ rare PACs/PVCs, 1 episode NSVT x4 beats;  normal echo 05-28-2024   Leiomyoma of uterus    Migraine with aura    Wears glasses    Past Surgical History:  Procedure Laterality Date   CESAREAN SECTION  01/13/2007   @[facility]  by Dr KANDICE. Latisha   CYSTOSCOPY Bilateral 08/22/2024   Procedure: CYSTOSCOPY;  Surgeon: Dallie Vera GAILS, MD;  [facility] OR;  Service: Gynecology;  Laterality: Bilateral;   DILATATION & CURETTAGE/HYSTEROSCOPY WITH MYOSURE N/A 12/12/2017   Procedure: DILATATION & CURETTAGE/HYSTEROSCOPY;  Surgeon: Jannis Kate Norris, MD;  Location: [facility] ORS;  Service: Gynecology;  Laterality: N/A;   HYSTERECTOMY ABDOMINAL WITH SALPINGECTOMY Bilateral 08/22/2024   Procedure: HYSTERECTOMY, TOTAL, ABDOMINAL, WITH SALPINGECTOMY;  Surgeon: Dallie Vera GAILS, MD;  Location: [facility] OR;  Service: Gynecology;  Laterality: Bilateral;   REPEAT CESAREAN SECTION  03/07/2009   @[facility]  by Dr JONETTA. Marget   WISDOM TOOTH EXTRACTION  2013   Current Outpatient Medications on File Prior to  Visit  Medication Sig Dispense Refill   Ascorbic Acid (VITAMIN C) 250 MG CHEW Chew 1 each by mouth daily.     Azelastine HCl 137 MCG/SPRAY SOLN Place 1 spray into the nose 2 (two) times daily.     calcium carbonate (TUMS - DOSED IN MG ELEMENTAL CALCIUM) 500 MG chewable tablet Chew 1 tablet by mouth as needed for indigestion or heartburn.     Cholecalciferol (VITAMIN D3) 50 MCG (2000 UT) CHEW Chew 1 each by mouth daily.     guaiFENesin (MUCINEX) 600 MG 12 hr tablet Take 600 mg by mouth 2 (two) times daily as needed for to loosen phlegm.     levocetirizine (XYZAL) 5 MG tablet Take 5 mg by mouth at bedtime.      levothyroxine  (SYNTHROID , LEVOTHROID) 75 MCG tablet Take 75 mcg by mouth daily before  breakfast.     Lysine 1000 MG TABS Take 0.5 tablets by mouth 2 (two) times daily.     metoprolol  tartrate (LOPRESSOR ) 25 MG tablet Take 12.5 mg by mouth 2 (two) times daily.     montelukast  (SINGULAIR ) 10 MG tablet Take 10 mg by mouth at bedtime.     Multiple Vitamins-Minerals (MULTIVITAMIN GUMMIES ADULTS PO) Take 2 each by mouth daily.     topiramate  (TOPAMAX ) 100 MG tablet Take 50 mg by mouth 2 (two) times daily.     valACYclovir (VALTREX) 1000 MG tablet Take 1,000 mg by mouth as needed.  2   No current facility-administered medications on file prior to visit.   Allergies  Allergen Reactions   Azelastine Hcl     Other Reaction(s): nasal bleeding   Triamcinolone     Other Reaction(s): dry sinuses   Tape Rash    ADHESIVE---  RED RASH   Zithromax [Azithromycin] Other (See Comments)    Caused weird heart palpitations type of issues      PE Today's Vitals   11/14/24 0813  BP: 102/64  Pulse: 73  Temp: 98.4 F (36.9 C)  TempSrc: Oral  SpO2: 99%  Weight: 187 lb (84.8 kg)    Body mass index is 33.66 kg/m.  Physical Exam Vitals reviewed. Exam conducted with a chaperone present.  Constitutional:      General: She is not in acute distress.    Appearance: Normal appearance.  HENT:     Head: Normocephalic and atraumatic.     Nose: Nose normal.  Eyes:     Extraocular Movements: Extraocular movements intact.     Conjunctiva/sclera: Conjunctivae normal.  Pulmonary:     Effort: Pulmonary effort is normal.  Abdominal:     General: There is no distension.     Palpations: Abdomen is soft.     Tenderness: There is no abdominal tenderness.     Comments: Pfannenstiel incision well healed.  Genitourinary:    General: Normal vulva.     Urethra: No prolapse or urethral swelling.     Vagina: Normal.     Comments: Vaginal cuff intact, no bleeding, no discharge, mild tenderness at 6:00 Musculoskeletal:        General: Normal range of motion.     Cervical back: Normal range of  motion.  Lymphadenopathy:     Lower Body: No right inguinal adenopathy. No left inguinal adenopathy.  Neurological:     General: No focal deficit present.     Mental Status: She is alert.  Psychiatric:        Mood and Affect: Mood normal.  Behavior: Behavior normal.     Assessment and Plan:        Postop check  Adnexal cyst -     US  PELVIS TRANSVAGINAL NON-OB (TV ONLY); Future  Surgically cleared. May resume all activities. Discussed TVUS in 4wk to reassess right adnexal cyst Consider HRT at that time All questions answered.    Vera LULLA Pa, MD  "

## 2024-12-13 ENCOUNTER — Other Ambulatory Visit

## 2024-12-13 ENCOUNTER — Other Ambulatory Visit: Admitting: Obstetrics and Gynecology
